# Patient Record
Sex: Female | Born: 1980 | Race: Black or African American | Hispanic: No | Marital: Single | Smoking: Never smoker
Health system: Southern US, Community
[De-identification: ages and names within clinical notes are randomized; demographics above are authoritative.]

## PROBLEM LIST (undated history)

## (undated) ENCOUNTER — Emergency Department (HOSPITAL_COMMUNITY): Payer: 59

## (undated) DIAGNOSIS — D219 Benign neoplasm of connective and other soft tissue, unspecified: Secondary | ICD-10-CM

## (undated) DIAGNOSIS — E119 Type 2 diabetes mellitus without complications: Secondary | ICD-10-CM

## (undated) DIAGNOSIS — E559 Vitamin D deficiency, unspecified: Secondary | ICD-10-CM

## (undated) DIAGNOSIS — M549 Dorsalgia, unspecified: Secondary | ICD-10-CM

## (undated) DIAGNOSIS — I10 Essential (primary) hypertension: Secondary | ICD-10-CM

## (undated) DIAGNOSIS — J449 Chronic obstructive pulmonary disease, unspecified: Secondary | ICD-10-CM

## (undated) DIAGNOSIS — E669 Obesity, unspecified: Secondary | ICD-10-CM

## (undated) DIAGNOSIS — J189 Pneumonia, unspecified organism: Secondary | ICD-10-CM

## (undated) DIAGNOSIS — Z91018 Allergy to other foods: Secondary | ICD-10-CM

## (undated) DIAGNOSIS — R0602 Shortness of breath: Secondary | ICD-10-CM

## (undated) DIAGNOSIS — G4730 Sleep apnea, unspecified: Secondary | ICD-10-CM

## (undated) DIAGNOSIS — F419 Anxiety disorder, unspecified: Secondary | ICD-10-CM

## (undated) DIAGNOSIS — F32A Depression, unspecified: Secondary | ICD-10-CM

## (undated) DIAGNOSIS — K219 Gastro-esophageal reflux disease without esophagitis: Secondary | ICD-10-CM

## (undated) DIAGNOSIS — R519 Headache, unspecified: Secondary | ICD-10-CM

## (undated) HISTORY — DX: Chronic obstructive pulmonary disease, unspecified: J44.9

## (undated) HISTORY — DX: Sleep apnea, unspecified: G47.30

## (undated) HISTORY — DX: Shortness of breath: R06.02

## (undated) HISTORY — DX: Essential (primary) hypertension: I10

## (undated) HISTORY — DX: Depression, unspecified: F32.A

## (undated) HISTORY — DX: Anxiety disorder, unspecified: F41.9

## (undated) HISTORY — DX: Vitamin D deficiency, unspecified: E55.9

## (undated) HISTORY — DX: Allergy to other foods: Z91.018

## (undated) HISTORY — PX: WISDOM TOOTH EXTRACTION: SHX21

## (undated) HISTORY — PX: GUM SURGERY: SHX658

## (undated) HISTORY — DX: Benign neoplasm of connective and other soft tissue, unspecified: D21.9

## (undated) HISTORY — DX: Type 2 diabetes mellitus without complications: E11.9

## (undated) HISTORY — DX: Dorsalgia, unspecified: M54.9

## (undated) HISTORY — DX: Obesity, unspecified: E66.9

## (undated) HISTORY — PX: TONSILLECTOMY: SHX5217

---

## 2000-12-20 ENCOUNTER — Ambulatory Visit (HOSPITAL_COMMUNITY): Admission: RE | Admit: 2000-12-20 | Discharge: 2000-12-20 | Payer: Self-pay | Admitting: *Deleted

## 2000-12-20 ENCOUNTER — Encounter: Payer: Self-pay | Admitting: *Deleted

## 2000-12-25 ENCOUNTER — Ambulatory Visit (HOSPITAL_COMMUNITY): Admission: RE | Admit: 2000-12-25 | Discharge: 2000-12-25 | Payer: Self-pay | Admitting: *Deleted

## 2000-12-25 ENCOUNTER — Encounter: Payer: Self-pay | Admitting: *Deleted

## 2001-02-02 ENCOUNTER — Emergency Department (HOSPITAL_COMMUNITY): Admission: EM | Admit: 2001-02-02 | Discharge: 2001-02-02 | Payer: Self-pay | Admitting: Emergency Medicine

## 2002-06-12 ENCOUNTER — Ambulatory Visit (HOSPITAL_COMMUNITY): Admission: RE | Admit: 2002-06-12 | Discharge: 2002-06-12 | Payer: Self-pay | Admitting: Obstetrics and Gynecology

## 2002-06-12 ENCOUNTER — Encounter: Payer: Self-pay | Admitting: Obstetrics and Gynecology

## 2004-03-16 ENCOUNTER — Other Ambulatory Visit: Admission: RE | Admit: 2004-03-16 | Discharge: 2004-03-16 | Payer: Self-pay

## 2006-06-30 ENCOUNTER — Emergency Department (HOSPITAL_COMMUNITY): Admission: EM | Admit: 2006-06-30 | Discharge: 2006-06-30 | Payer: Self-pay | Admitting: Emergency Medicine

## 2006-07-10 ENCOUNTER — Ambulatory Visit: Payer: Self-pay | Admitting: Orthopedic Surgery

## 2006-07-18 ENCOUNTER — Encounter (HOSPITAL_COMMUNITY): Admission: RE | Admit: 2006-07-18 | Discharge: 2006-08-17 | Payer: Self-pay | Admitting: Orthopedic Surgery

## 2006-08-07 ENCOUNTER — Ambulatory Visit: Payer: Self-pay | Admitting: Orthopedic Surgery

## 2006-08-21 ENCOUNTER — Encounter (HOSPITAL_COMMUNITY): Admission: RE | Admit: 2006-08-21 | Discharge: 2006-09-20 | Payer: Self-pay | Admitting: Orthopedic Surgery

## 2006-08-28 ENCOUNTER — Ambulatory Visit: Payer: Self-pay | Admitting: Orthopedic Surgery

## 2007-03-02 ENCOUNTER — Encounter: Admission: RE | Admit: 2007-03-02 | Discharge: 2007-03-02 | Payer: Self-pay | Admitting: Obstetrics & Gynecology

## 2007-05-14 ENCOUNTER — Emergency Department (HOSPITAL_COMMUNITY): Admission: EM | Admit: 2007-05-14 | Discharge: 2007-05-14 | Payer: Self-pay | Admitting: Emergency Medicine

## 2007-08-22 ENCOUNTER — Ambulatory Visit (HOSPITAL_COMMUNITY): Admission: RE | Admit: 2007-08-22 | Discharge: 2007-08-22 | Payer: Self-pay | Admitting: Internal Medicine

## 2008-09-22 ENCOUNTER — Emergency Department (HOSPITAL_COMMUNITY): Admission: EM | Admit: 2008-09-22 | Discharge: 2008-09-22 | Payer: Self-pay | Admitting: Emergency Medicine

## 2009-11-18 ENCOUNTER — Emergency Department (HOSPITAL_COMMUNITY): Admission: EM | Admit: 2009-11-18 | Discharge: 2009-11-18 | Payer: Self-pay | Admitting: Family Medicine

## 2010-01-06 ENCOUNTER — Other Ambulatory Visit: Admission: RE | Admit: 2010-01-06 | Discharge: 2010-01-06 | Payer: Self-pay | Admitting: Obstetrics and Gynecology

## 2010-02-03 ENCOUNTER — Ambulatory Visit: Payer: Self-pay | Admitting: Internal Medicine

## 2010-02-03 DIAGNOSIS — G4733 Obstructive sleep apnea (adult) (pediatric): Secondary | ICD-10-CM

## 2010-02-03 DIAGNOSIS — G478 Other sleep disorders: Secondary | ICD-10-CM

## 2010-02-03 HISTORY — DX: Obstructive sleep apnea (adult) (pediatric): G47.33

## 2010-02-08 DIAGNOSIS — E785 Hyperlipidemia, unspecified: Secondary | ICD-10-CM

## 2010-02-08 DIAGNOSIS — I10 Essential (primary) hypertension: Secondary | ICD-10-CM | POA: Insufficient documentation

## 2010-02-08 DIAGNOSIS — J45909 Unspecified asthma, uncomplicated: Secondary | ICD-10-CM | POA: Insufficient documentation

## 2010-02-08 DIAGNOSIS — J309 Allergic rhinitis, unspecified: Secondary | ICD-10-CM | POA: Insufficient documentation

## 2010-02-08 HISTORY — DX: Hyperlipidemia, unspecified: E78.5

## 2010-02-10 ENCOUNTER — Ambulatory Visit: Payer: Self-pay | Admitting: Cardiology

## 2010-02-10 LAB — CONVERTED CEMR LAB: Glucose, Bld: 72 mg/dL (ref 70–99)

## 2010-03-16 ENCOUNTER — Encounter: Payer: Self-pay | Admitting: Internal Medicine

## 2010-03-16 ENCOUNTER — Ambulatory Visit (HOSPITAL_BASED_OUTPATIENT_CLINIC_OR_DEPARTMENT_OTHER): Admission: RE | Admit: 2010-03-16 | Discharge: 2010-03-16 | Payer: Self-pay | Admitting: Internal Medicine

## 2010-03-31 ENCOUNTER — Ambulatory Visit: Payer: Self-pay | Admitting: Internal Medicine

## 2010-05-12 ENCOUNTER — Ambulatory Visit: Payer: Self-pay | Admitting: Internal Medicine
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
## 2010-09-29 ENCOUNTER — Other Ambulatory Visit: Payer: Self-pay | Admitting: Family Medicine

## 2010-09-29 DIAGNOSIS — R10811 Right upper quadrant abdominal tenderness: Secondary | ICD-10-CM

## 2010-09-30 ENCOUNTER — Ambulatory Visit
Admission: RE | Admit: 2010-09-30 | Discharge: 2010-09-30 | Disposition: A | Discharge status: Home / Self Care | Payer: BC Managed Care – PPO | Source: Ambulatory Visit | Attending: Family Medicine | Admitting: Family Medicine

## 2010-09-30 DIAGNOSIS — R10811 Right upper quadrant abdominal tenderness: Secondary | ICD-10-CM

## 2010-10-07 LAB — POCT RAPID STREP A (OFFICE): Streptococcus, Group A Screen (Direct): NEGATIVE

## 2010-10-27 ENCOUNTER — Other Ambulatory Visit: Payer: Self-pay | Admitting: Family Medicine

## 2010-10-27 DIAGNOSIS — R1011 Right upper quadrant pain: Secondary | ICD-10-CM

## 2010-10-29 ENCOUNTER — Other Ambulatory Visit: Payer: BC Managed Care – PPO

## 2010-11-02 ENCOUNTER — Ambulatory Visit
Admission: RE | Admit: 2010-11-02 | Discharge: 2010-11-02 | Disposition: A | Discharge status: Home / Self Care | Payer: BC Managed Care – PPO | Source: Ambulatory Visit | Attending: Family Medicine | Admitting: Family Medicine

## 2010-11-02 DIAGNOSIS — R1011 Right upper quadrant pain: Secondary | ICD-10-CM

## 2010-11-02 MED ORDER — IOHEXOL 300 MG/ML  SOLN
125.0000 mL | Freq: Once | INTRAMUSCULAR | Status: AC | PRN
  Administered 2010-11-02: 125 mL via INTRAVENOUS

## 2011-02-16 ENCOUNTER — Other Ambulatory Visit (HOSPITAL_COMMUNITY)
Admission: RE | Admit: 2011-02-16 | Discharge: 2011-02-16 | Disposition: A | Discharge status: Home / Self Care | Payer: BC Managed Care – PPO | Source: Ambulatory Visit | Attending: Obstetrics and Gynecology | Admitting: Obstetrics and Gynecology

## 2011-02-16 DIAGNOSIS — Z01419 Encounter for gynecological examination (general) (routine) without abnormal findings: Secondary | ICD-10-CM | POA: Insufficient documentation

## 2011-05-10 ENCOUNTER — Encounter: Payer: Self-pay | Admitting: Internal Medicine

## 2011-05-11 ENCOUNTER — Ambulatory Visit: Payer: Self-pay | Admitting: Internal Medicine

## 2012-08-05 ENCOUNTER — Encounter (HOSPITAL_COMMUNITY): Payer: Self-pay | Admitting: Emergency Medicine

## 2012-08-05 ENCOUNTER — Emergency Department (HOSPITAL_COMMUNITY)
Admission: EM | Admit: 2012-08-05 | Discharge: 2012-08-05 | Disposition: A | Discharge status: Home / Self Care | Payer: BC Managed Care – PPO | Source: Home / Self Care

## 2012-08-05 DIAGNOSIS — M62830 Muscle spasm of back: Secondary | ICD-10-CM

## 2012-08-05 DIAGNOSIS — M5380 Other specified dorsopathies, site unspecified: Secondary | ICD-10-CM

## 2012-08-05 MED ORDER — PREDNISONE (PAK) 10 MG PO TABS: 10.0000 mg | ORAL_TABLET | Freq: Every day | ORAL | Status: DC

## 2012-08-05 MED ORDER — HYDROCODONE-ACETAMINOPHEN 5-325 MG PO TABS: 1.0000 | ORAL_TABLET | Freq: Four times a day (QID) | ORAL | Status: DC | PRN

## 2012-08-05 NOTE — ED Provider Notes (Signed)
Gina Walker is a 32 y.o. female who presents to Urgent Care today for right sided back pain present since Wednesday. Pain located in the right lumbar and thoracic paraspinal muscles. One or 2 episodes of pain radiating to the right leg. No numbness weakness bowel bladder dysfunction or difficulty walking. Patient has tried ibuprofen and Tylenol which is on been mildly helpful. She denies any significant back history.  No fevers or chills or injury.    PMH reviewed. Obesity and asthma History  Substance Use Topics  . Smoking status: Current Every Day Smoker  . Smokeless tobacco: Not on file  . Alcohol Use: Yes     Comment: glass of wine 2x month   ROS as above Medications reviewed. No current facility-administered medications for this encounter.   Current Outpatient Prescriptions  Medication Sig Dispense Refill  . albuterol (PROAIR HFA) 108 (90 BASE) MCG/ACT inhaler Inhale 2 puffs into the lungs 4 (four) times daily as needed.        . budesonide-formoterol (SYMBICORT) 160-4.5 MCG/ACT inhaler Inhale 2 puffs into the lungs 2 (two) times daily.        . chlorthalidone (HYGROTON) 25 MG tablet Take 1 by mouth once daily to keep BP under 140/90       . cholecalciferol (VITAMIN D) 1000 UNITS tablet Take 1,000 Units by mouth daily.        . fluticasone (FLONASE) 50 MCG/ACT nasal spray Place 2 sprays into the nose at bedtime.        . fluticasone (VERAMYST) 27.5 MCG/SPRAY nasal spray Place 2 sprays into the nose at bedtime.        Marland Kitchen HYDROcodone-acetaminophen (NORCO/VICODIN) 5-325 MG per tablet Take 1 tablet by mouth every 6 (six) hours as needed for pain.  10 tablet  0  . Multiple Vitamin (MULTIVITAMIN) capsule Take 1 capsule by mouth daily.        . norethindrone (ERRIN) 0.35 MG tablet Take 1 tablet by mouth daily.        . potassium chloride SA (K-DUR,KLOR-CON) 20 MEQ tablet Take 20 mEq by mouth daily.        . predniSONE (STERAPRED UNI-PAK) 10 MG tablet Take 1 tablet (10 mg total) by mouth  daily. Days 1-4: Two tablets before breakfast, one after lunch, one after dinner, and two at bedtime. If started late in the day, take two tablets every hour for three hours, unless otherwise directed by prescriber. Days 5-8: One tablet before breakfast, one after lunch, one after dinner, and one at bedtime Days 9-12: One tablet before breakfast and one at bedtime Disp qs  1 tablet  0  . vitamin C (ASCORBIC ACID) 500 MG tablet Take 500 mg by mouth daily.        . Zinc 100 MG TABS Take 1 tablet by mouth daily.          Exam:  Pulse 74  Temp(Src) 99.2 F (37.3 C) (Oral)  Resp 17  SpO2 99% Gen: Well NAD BACK: Nontender spinal midline. Tender to palpation in right paraspinal muscles of the lumbar thoracic spine. Range of motion normal to flexion extension rotation and lateral flexion Reflexes 2+ equal in knees and ankles. Strength intact to toe standing heel standing squat with a normal gait.  Patient get on and off exam table by herself.  Sensation pulses and capillary refill are intact bilateral lower extremity.    No results found for this or any previous visit (from the past 24 hour(s)). No results  found.  Assessment and Plan: 32 y.o. female with back muscle spasm.  Plan for 12 date Sterapred DS Dosepak, and hydrocodone.  Offered physical therapy. Patient will think about it.  Followup as needed.  Discussed warning signs or symptoms. Please see discharge instructions. Patient expresses understanding.      Rodolph Bong, MD 08/05/12 1620

## 2012-08-05 NOTE — ED Notes (Signed)
Pt c/o right lower back and flank pain since Wednesday. Pt denies urinary symptoms. Some nausea and constipation. Pt has a hx of fibroids.   Pt states she is unable to sleep at night due to the pain.

## 2012-08-07 NOTE — ED Provider Notes (Signed)
Medical screening examination/treatment/procedure(s) were performed by resident physician or non-physician practitioner and as supervising physician I was immediately available for consultation/collaboration.   Barkley Bruns MD.   Linna Hoff, MD 08/07/12 [phone]

## 2013-01-10 ENCOUNTER — Ambulatory Visit: Payer: Self-pay | Admitting: Obstetrics & Gynecology

## 2013-01-11 ENCOUNTER — Encounter: Payer: Self-pay | Admitting: Obstetrics & Gynecology

## 2013-01-11 ENCOUNTER — Ambulatory Visit (INDEPENDENT_AMBULATORY_CARE_PROVIDER_SITE_OTHER): Payer: BC Managed Care – PPO | Admitting: Obstetrics & Gynecology

## 2013-01-11 VITALS — BP 130/80 | Ht 63.0 in | Wt 245.0 lb

## 2013-01-11 DIAGNOSIS — N920 Excessive and frequent menstruation with regular cycle: Secondary | ICD-10-CM

## 2013-01-11 DIAGNOSIS — D259 Leiomyoma of uterus, unspecified: Secondary | ICD-10-CM

## 2013-01-11 DIAGNOSIS — N946 Dysmenorrhea, unspecified: Secondary | ICD-10-CM | POA: Insufficient documentation

## 2013-01-11 DIAGNOSIS — Z3202 Encounter for pregnancy test, result negative: Secondary | ICD-10-CM

## 2013-01-11 LAB — POCT URINE PREGNANCY: Preg Test, Ur: NEGATIVE

## 2013-01-11 NOTE — Patient Instructions (Signed)
Uterine Fibroid A uterine fibroid is a growth (tumor) that occurs in a woman's uterus. This type of tumor is not cancerous and does not spread out of the uterus. A woman can have one or many fibroids, and the fiboid(s) can become quite large. A fibroid can vary in size, weight, and where it grows in the uterus. Most fibroids do not require medical treatment, but some can cause pain or heavy bleeding during and between periods. CAUSES  A fibroid is the result of a single uterine cell that keeps growing (unregulated), which is different than most cells in the human body. Most cells have a control mechanism that keeps them from reproducing without control.  SYMPTOMS   Bleeding.  Pelvic pain and pressure.  Bladder problems due to the size of the fibroid.  Infertility and miscarriages depending on the size and location of the fibroid. DIAGNOSIS  A diagnosis is made by physical exam. Your caregiver may feel the lumpy tumors during a pelvic exam. Important information regarding size, location, and number of tumors can be gained by having an ultrasound. It is rare that other tests, such as a CT scan or MRI, are needed. TREATMENT   Your caregiver may recommend watchful waiting. This involves getting the fibroid checked by your caregiver to see if the fibroids grow or shrink.   Hormonal treatment or an intrauterine device (IUD) may be prescribed.   Surgery may be needed to remove the fibroids (myomectomy) or the uterus (hysterectomy). This depends on your situation. When fibroids interfere with fertility and a woman wants to become pregnant, a caregiver may recommend having the fibroids removed.  HOME CARE INSTRUCTIONS  Home care depends on how you were treated. In general:   Keep all follow-up appointments with your caregiver.   Only take medicine as told by your caregiver. Do not take aspirin. It can cause bleeding.   If you have excessive periods and soak tampons or pads in a half hour or  less, contact your caregiver immediately. If your periods are troublesome but not so heavy, lie down with your feet raised slightly above your heart. Place cold packs on your lower abdomen.   If your periods are heavy, write down the number of pads or tampons you use per month. Bring this information to your caregiver.   Talk to your caregiver about taking iron pills.   Include green vegetables in your diet.   If you were prescribed a hormonal treatment, take the hormonal medicines as directed.   If you need surgery, ask your caregiver for information on your specific surgery.  SEEK IMMEDIATE MEDICAL CARE IF:  You have pelvic pain or cramps not controlled with medicines.   You have a sudden increase in pelvic pain.   You have an increase of bleeding between and during periods.   You feel lightheaded or have fainting episodes.  MAKE SURE YOU:  Understand these instructions.  Will watch your condition.  Will get help right away if you are not doing well or get worse. Document Released: 06/10/2000 Document Revised: 09/05/2011 Document Reviewed: 07/04/2011 [employer] Patient Information 2014 [employer].  

## 2013-01-11 NOTE — Progress Notes (Signed)
Patient ID: Gina Walker, female   DOB: [birth year], 32 y.o.   MRN: [id number] Gina Walker comes in for evaluation of a terrible menses in July she skipped her menstrual period in June her pregnancy test today is negative She says the pain from 1-10 was in 19 She is known to have fibroid Date she was told to years ago by Dr. Emelda Fear that they were his big as his hand She has had no imaging to differentiate number or size that I can find in her chart And she states she has had none  On exam today Her uterus is approximately 14 weeks' size and I can feel the outline of multiple fibroids adnexa feels negative nontender today  Essentially we did take some time with Sue Lush and evaluate the size and number of fibroids so we can make clinical decisions going forward She and her fianc are all fired up to have them removed but I told him we decided to take it one step at the time She's never been pregnant uses condoms sporadically as she gets pregnant So I will see her back in a couple of weeks to have a sonogram and talk about the findings of that

## 2013-01-29 ENCOUNTER — Ambulatory Visit (INDEPENDENT_AMBULATORY_CARE_PROVIDER_SITE_OTHER): Payer: BC Managed Care – PPO | Admitting: Obstetrics & Gynecology

## 2013-01-29 ENCOUNTER — Encounter: Payer: Self-pay | Admitting: Obstetrics & Gynecology

## 2013-01-29 ENCOUNTER — Other Ambulatory Visit: Payer: Self-pay | Admitting: Obstetrics & Gynecology

## 2013-01-29 ENCOUNTER — Ambulatory Visit (INDEPENDENT_AMBULATORY_CARE_PROVIDER_SITE_OTHER): Payer: BC Managed Care – PPO

## 2013-01-29 VITALS — BP 140/88 | Wt 245.0 lb

## 2013-01-29 DIAGNOSIS — D259 Leiomyoma of uterus, unspecified: Secondary | ICD-10-CM

## 2013-01-29 DIAGNOSIS — N920 Excessive and frequent menstruation with regular cycle: Secondary | ICD-10-CM

## 2013-01-29 DIAGNOSIS — N946 Dysmenorrhea, unspecified: Secondary | ICD-10-CM

## 2013-01-29 MED ORDER — NORGESTIMATE-ETH ESTRADIOL 0.25-35 MG-MCG PO TABS: 1.0000 | ORAL_TABLET | Freq: Every day | ORAL | Status: DC

## 2013-01-29 MED ORDER — KETOROLAC TROMETHAMINE 10 MG PO TABS: 10.0000 mg | ORAL_TABLET | Freq: Three times a day (TID) | ORAL | Status: DC | PRN

## 2013-01-29 NOTE — Patient Instructions (Addendum)
Uterine Fibroid A uterine fibroid is a growth (tumor) that occurs in a woman's uterus. This type of tumor is not cancerous and does not spread out of the uterus. A woman can have one or many fibroids, and the fiboid(s) can become quite large. A fibroid can vary in size, weight, and where it grows in the uterus. Most fibroids do not require medical treatment, but some can cause pain or heavy bleeding during and between periods. CAUSES  A fibroid is the result of a single uterine cell that keeps growing (unregulated), which is different than most cells in the human body. Most cells have a control mechanism that keeps them from reproducing without control.  SYMPTOMS   Bleeding.  Pelvic pain and pressure.  Bladder problems due to the size of the fibroid.  Infertility and miscarriages depending on the size and location of the fibroid. DIAGNOSIS  A diagnosis is made by physical exam. Your caregiver may feel the lumpy tumors during a pelvic exam. Important information regarding size, location, and number of tumors can be gained by having an ultrasound. It is rare that other tests, such as a CT scan or MRI, are needed. TREATMENT   Your caregiver may recommend watchful waiting. This involves getting the fibroid checked by your caregiver to see if the fibroids grow or shrink.   Hormonal treatment or an intrauterine device (IUD) may be prescribed.   Surgery may be needed to remove the fibroids (myomectomy) or the uterus (hysterectomy). This depends on your situation. When fibroids interfere with fertility and a woman wants to become pregnant, a caregiver may recommend having the fibroids removed.  HOME CARE INSTRUCTIONS  Home care depends on how you were treated. In general:   Keep all follow-up appointments with your caregiver.   Only take medicine as told by your caregiver. Do not take aspirin. It can cause bleeding.   If you have excessive periods and soak tampons or pads in a half hour or  less, contact your caregiver immediately. If your periods are troublesome but not so heavy, lie down with your feet raised slightly above your heart. Place cold packs on your lower abdomen.   If your periods are heavy, write down the number of pads or tampons you use per month. Bring this information to your caregiver.   Talk to your caregiver about taking iron pills.   Include green vegetables in your diet.   If you were prescribed a hormonal treatment, take the hormonal medicines as directed.   If you need surgery, ask your caregiver for information on your specific surgery.  SEEK IMMEDIATE MEDICAL CARE IF:  You have pelvic pain or cramps not controlled with medicines.   You have a sudden increase in pelvic pain.   You have an increase of bleeding between and during periods.   You feel lightheaded or have fainting episodes.  MAKE SURE YOU:  Understand these instructions.  Will watch your condition.  Will get help right away if you are not doing well or get worse. Document Released: 06/10/2000 Document Revised: 09/05/2011 Document Reviewed: 07/04/2011 [employer] Patient Information 2014 [employer].  

## 2013-01-29 NOTE — Progress Notes (Signed)
Patient ID: Gina Walker, female   DOB: 12-Nov-1980, 32 y.o.   MRN: [id number] Please refer to the note previously from July  Sonogram today is reviewed He see report  Recommend that we try oral contraceptive pills and Toradol and see if that will help her periods It should also shrink the fibroids at this size and maybe even shrink them We will see her back is need her for yearly or as needed

## 2013-02-11 ENCOUNTER — Other Ambulatory Visit: Payer: Self-pay | Admitting: Adult Health

## 2013-02-18 ENCOUNTER — Other Ambulatory Visit: Payer: Self-pay | Admitting: Adult Health

## 2013-02-20 ENCOUNTER — Other Ambulatory Visit: Payer: Self-pay | Admitting: Adult Health

## 2013-04-17 ENCOUNTER — Encounter: Payer: Self-pay | Admitting: Adult Health

## 2013-04-17 ENCOUNTER — Ambulatory Visit (INDEPENDENT_AMBULATORY_CARE_PROVIDER_SITE_OTHER): Payer: BC Managed Care – PPO | Admitting: Adult Health

## 2013-04-17 ENCOUNTER — Other Ambulatory Visit (HOSPITAL_COMMUNITY)
Admission: RE | Admit: 2013-04-17 | Discharge: 2013-04-17 | Disposition: A | Discharge status: Home / Self Care | Payer: BC Managed Care – PPO | Source: Ambulatory Visit | Attending: Adult Health | Admitting: Adult Health

## 2013-04-17 VITALS — BP 144/94 | HR 76 | Ht 63.5 in | Wt 251.0 lb

## 2013-04-17 DIAGNOSIS — J45909 Unspecified asthma, uncomplicated: Secondary | ICD-10-CM

## 2013-04-17 DIAGNOSIS — Z1151 Encounter for screening for human papillomavirus (HPV): Secondary | ICD-10-CM | POA: Insufficient documentation

## 2013-04-17 DIAGNOSIS — Z309 Encounter for contraceptive management, unspecified: Secondary | ICD-10-CM

## 2013-04-17 DIAGNOSIS — Z113 Encounter for screening for infections with a predominantly sexual mode of transmission: Secondary | ICD-10-CM

## 2013-04-17 DIAGNOSIS — Z01419 Encounter for gynecological examination (general) (routine) without abnormal findings: Secondary | ICD-10-CM

## 2013-04-17 DIAGNOSIS — E669 Obesity, unspecified: Secondary | ICD-10-CM

## 2013-04-17 DIAGNOSIS — D219 Benign neoplasm of connective and other soft tissue, unspecified: Secondary | ICD-10-CM | POA: Insufficient documentation

## 2013-04-17 DIAGNOSIS — I10 Essential (primary) hypertension: Secondary | ICD-10-CM

## 2013-04-17 HISTORY — DX: Obesity, unspecified: E66.9

## 2013-04-17 MED ORDER — NORGESTIMATE-ETH ESTRADIOL 0.25-35 MG-MCG PO TABS: 1.0000 | ORAL_TABLET | Freq: Every day | ORAL | Status: DC

## 2013-04-17 NOTE — Patient Instructions (Signed)
Calorie Counting Diet A calorie counting diet requires you to eat the number of calories that are right for you in a day. Calories are the measurement of how much energy you get from the food you eat. Eating the right amount of calories is important for staying at a healthy weight. If you eat too many calories, your body will store them as fat and you may gain weight. If you eat too few calories, you may lose weight. Counting the number of calories you eat during a day will help you know if you are eating the right amount. A Registered Dietitian can determine how many calories you need in a day. The amount of calories needed varies from person to person. If your goal is to lose weight, you will need to eat fewer calories. Losing weight can benefit you if you are overweight or have health problems such as heart disease, high blood pressure, or diabetes. If your goal is to gain weight, you will need to eat more calories. Gaining weight may be necessary if you have a certain health problem that causes your body to need more energy. TIPS Whether you are increasing or decreasing the number of calories you eat during a day, it may be hard to get used to changes in what you eat and drink. The following are tips to help you keep track of the number of calories you eat.  Measure foods at home with measuring cups. This helps you know the amount of food and number of calories you are eating.  Restaurants often serve food in amounts that are larger than 1 serving. While eating out, estimate how many servings of a food you are given. For example, a serving of cooked rice is  cup or about the size of half of a fist. Knowing serving sizes will help you be aware of how much food you are eating at restaurants.  Ask for smaller portion sizes or child-size portions at restaurants.  Plan to eat half of a meal at a restaurant. Take the rest home or share the other half with a friend.  Read the Nutrition Facts panel on  food labels for calorie content and serving size. You can find out how many servings are in a package, the size of a serving, and the number of calories each serving has.  For example, a package might contain 3 cookies. The Nutrition Facts panel on that package says that 1 serving is 1 cookie. Below that, it will say there are 3 servings in the container. The calories section of the Nutrition Facts label says there are 90 calories. This means there are 90 calories in 1 cookie (1 serving). If you eat 1 cookie you have eaten 90 calories. If you eat all 3 cookies, you have eaten 270 calories (3 servings x 90 calories = 270 calories). The list below tells you how big or small some common portion sizes are.  1 oz.........4 stacked dice.  3 oz.........Deck of cards.  1 tsp........Tip of little finger.  1 tbs........Thumb.  2 tbs........Golf ball.   cup.......Half of a fist.  1 cup........A fist. KEEP A FOOD LOG Write down every food item you eat, the amount you eat, and the number of calories in each food you eat during the day. At the end of the day, you can add up the total number of calories you have eaten. It may help to keep a list like the one below. Find out the calorie information by reading the   Nutrition Facts panel on food labels. Breakfast  Bran cereal (1 cup, 110 calories).  Fat-free milk ( cup, 45 calories). Snack  Apple (1 medium, 80 calories). Lunch  Spinach (1 cup, 20 calories).  Tomato ( medium, 20 calories).  Chicken breast strips (3 oz, 165 calories).  Shredded cheddar cheese ( cup, 110 calories).  Light [place] dressing (2 tbs, 60 calories).  Whole-wheat bread (1 slice, 80 calories).  Tub margarine (1 tsp, 35 calories).  Vegetable soup (1 cup, 160 calories). Dinner  Pork chop (3 oz, 190 calories).  Brown rice (1 cup, 215 calories).  Steamed broccoli ( cup, 20 calories).  Strawberries (1  cup, 65 calories).  Whipped cream (1 tbs, 50  calories). Daily Calorie Total: 1425 Document Released: 06/13/2005 Document Revised: 09/05/2011 Document Reviewed: 12/08/2006 [facility] Patient Information 2014 [place], [place]. Cut carbs Follow up in 1 year

## 2013-04-17 NOTE — Addendum Note (Signed)
Addended by: Cyril Mourning A on: 04/17/2013 03:20 PM   Modules accepted: Orders

## 2013-04-17 NOTE — Progress Notes (Signed)
Patient ID: Gina Walker, female   DOB: February 22, 1981, 32 y.o.   MRN: [id number] History of Present Illness: Hasset is a 32 year old black female single in for a pap and physical.She is on sprintec but last period was heavier than usual, she has known fibroids.   Current Medications, Allergies, Past Medical History, Past Surgical History, Family History and Social History were reviewed in [employer] record.     Review of Systems: Patient denies any headaches, blurred vision, shortness of breath, chest pain, abdominal pain, problems with bowel movements, urination, or intercourse. No joint pain or mood changes.    Physical Exam:BP 144/94  Pulse 76  Ht 5' 3.5" (1.613 m)  Wt 251 lb (113.853 kg)  BMI 43.76 kg/m2  LMP 03/27/2013 General:  Well developed, well nourished, no acute distress Skin:  Warm and dry Neck:  Midline trachea, normal thyroid Lungs; Clear to auscultation bilaterally Breast:  No dominant palpable mass, retraction, or nipple discharge Cardiovascular: Regular rate and rhythm Abdomen:  Soft, non tender, no hepatosplenomegaly Pelvic:  External genitalia is normal in appearance.  The vagina is normal in appearance.  The cervix is smooth pap performed with HPV.  Uterus is felt to be enlarged about 16 week size.Marland Kitchen  No                adnexal masses or tenderness noted. Extremities:  No swelling or varicosities noted Psych:  No mood changes, alert and cooperative  Impression: Yearly gyn exam Contraceptive management Fibroids History of hypertension and asthma Obesity  Plan: Physical in 1 year Refilled sprintec x 1 year Try Atkins diet at least cut carbs GC/CHL sent

## 2013-04-18 LAB — GC/CHLAMYDIA PROBE AMP
CT Probe RNA: NEGATIVE
GC Probe RNA: NEGATIVE

## 2013-04-24 ENCOUNTER — Other Ambulatory Visit: Payer: Self-pay | Admitting: *Deleted

## 2013-04-24 MED ORDER — NORGESTIMATE-ETH ESTRADIOL 0.25-35 MG-MCG PO TABS: 1.0000 | ORAL_TABLET | Freq: Every day | ORAL | Status: DC

## 2013-08-05 ENCOUNTER — Encounter (HOSPITAL_COMMUNITY): Payer: Self-pay | Admitting: Emergency Medicine

## 2013-08-05 ENCOUNTER — Emergency Department (HOSPITAL_COMMUNITY): Payer: BC Managed Care – PPO

## 2013-08-05 ENCOUNTER — Emergency Department (HOSPITAL_COMMUNITY)
Admission: EM | Admit: 2013-08-05 | Discharge: 2013-08-05 | Disposition: A | Discharge status: Home / Self Care | Payer: BC Managed Care – PPO | Attending: Emergency Medicine | Admitting: Emergency Medicine

## 2013-08-05 DIAGNOSIS — Z792 Long term (current) use of antibiotics: Secondary | ICD-10-CM | POA: Insufficient documentation

## 2013-08-05 DIAGNOSIS — R5383 Other fatigue: Secondary | ICD-10-CM

## 2013-08-05 DIAGNOSIS — J45901 Unspecified asthma with (acute) exacerbation: Secondary | ICD-10-CM | POA: Insufficient documentation

## 2013-08-05 DIAGNOSIS — Z79899 Other long term (current) drug therapy: Secondary | ICD-10-CM | POA: Insufficient documentation

## 2013-08-05 DIAGNOSIS — B9789 Other viral agents as the cause of diseases classified elsewhere: Secondary | ICD-10-CM

## 2013-08-05 DIAGNOSIS — R110 Nausea: Secondary | ICD-10-CM | POA: Insufficient documentation

## 2013-08-05 DIAGNOSIS — J069 Acute upper respiratory infection, unspecified: Secondary | ICD-10-CM | POA: Insufficient documentation

## 2013-08-05 DIAGNOSIS — IMO0002 Reserved for concepts with insufficient information to code with codable children: Secondary | ICD-10-CM | POA: Insufficient documentation

## 2013-08-05 DIAGNOSIS — M940 Chondrocostal junction syndrome [Tietze]: Secondary | ICD-10-CM | POA: Insufficient documentation

## 2013-08-05 DIAGNOSIS — R5381 Other malaise: Secondary | ICD-10-CM | POA: Insufficient documentation

## 2013-08-05 DIAGNOSIS — Z9109 Other allergy status, other than to drugs and biological substances: Secondary | ICD-10-CM | POA: Insufficient documentation

## 2013-08-05 DIAGNOSIS — Z8742 Personal history of other diseases of the female genital tract: Secondary | ICD-10-CM | POA: Insufficient documentation

## 2013-08-05 DIAGNOSIS — E669 Obesity, unspecified: Secondary | ICD-10-CM | POA: Insufficient documentation

## 2013-08-05 DIAGNOSIS — J988 Other specified respiratory disorders: Secondary | ICD-10-CM

## 2013-08-05 DIAGNOSIS — I10 Essential (primary) hypertension: Secondary | ICD-10-CM | POA: Insufficient documentation

## 2013-08-05 LAB — APTT: aPTT: 29 seconds (ref 24–37)

## 2013-08-05 LAB — COMPREHENSIVE METABOLIC PANEL
ALBUMIN: 3.3 g/dL — AB (ref 3.5–5.2)
ALT: 13 U/L (ref 0–35)
AST: 14 U/L (ref 0–37)
Alkaline Phosphatase: 55 U/L (ref 39–117)
BUN: 9 mg/dL (ref 6–23)
CALCIUM: 8.9 mg/dL (ref 8.4–10.5)
CO2: 22 mEq/L (ref 19–32)
Chloride: 101 mEq/L (ref 96–112)
Creatinine, Ser: 0.95 mg/dL (ref 0.50–1.10)
GFR calc Af Amer: 90 mL/min — ABNORMAL LOW (ref >90)
GFR calc non Af Amer: 77 mL/min — ABNORMAL LOW (ref >90)
Glucose, Bld: 94 mg/dL (ref 70–99)
POTASSIUM: 3.9 meq/L (ref 3.7–5.3)
Sodium: 139 mEq/L (ref 137–147)
TOTAL PROTEIN: 7.5 g/dL (ref 6.0–8.3)
Total Bilirubin: <0.2 mg/dL — ABNORMAL LOW (ref 0.3–1.2)

## 2013-08-05 LAB — URINE MICROSCOPIC-ADD ON

## 2013-08-05 LAB — CBC WITH DIFFERENTIAL/PLATELET
BASOS ABS: 0 10*3/uL (ref 0.0–0.1)
BASOS PCT: 0 % (ref 0–1)
EOS ABS: 0.3 10*3/uL (ref 0.0–0.7)
Eosinophils Relative: 4 % (ref 0–5)
HCT: 41.3 % (ref 36.0–46.0)
HEMOGLOBIN: 14.2 g/dL (ref 12.0–15.0)
Lymphocytes Relative: 36 % (ref 12–46)
Lymphs Abs: 3.1 10*3/uL (ref 0.7–4.0)
MCH: 29.2 pg (ref 26.0–34.0)
MCHC: 34.4 g/dL (ref 30.0–36.0)
MCV: 84.8 fL (ref 78.0–100.0)
MONOS PCT: 7 % (ref 3–12)
Monocytes Absolute: 0.6 10*3/uL (ref 0.1–1.0)
NEUTROS ABS: 4.4 10*3/uL (ref 1.7–7.7)
Neutrophils Relative %: 52 % (ref 43–77)
Platelets: 304 10*3/uL (ref 150–400)
RBC: 4.87 MIL/uL (ref 3.87–5.11)
RDW: 14.5 % (ref 11.5–15.5)
WBC: 8.4 10*3/uL (ref 4.0–10.5)

## 2013-08-05 LAB — URINALYSIS, ROUTINE W REFLEX MICROSCOPIC
BILIRUBIN URINE: NEGATIVE
GLUCOSE, UA: NEGATIVE mg/dL
KETONES UR: NEGATIVE mg/dL
Leukocytes, UA: NEGATIVE
Nitrite: NEGATIVE
PH: 6 (ref 5.0–8.0)
Protein, ur: NEGATIVE mg/dL
SPECIFIC GRAVITY, URINE: 1.016 (ref 1.005–1.030)
Urobilinogen, UA: 0.2 mg/dL (ref 0.0–1.0)

## 2013-08-05 LAB — POCT I-STAT TROPONIN I
TROPONIN I, POC: 0 ng/mL (ref 0.00–0.08)
Troponin i, poc: 0 ng/mL (ref 0.00–0.08)

## 2013-08-05 LAB — PRO B NATRIURETIC PEPTIDE

## 2013-08-05 MED ORDER — ALBUTEROL SULFATE (2.5 MG/3ML) 0.083% IN NEBU
5.0000 mg | INHALATION_SOLUTION | Freq: Once | RESPIRATORY_TRACT | Status: AC
  Administered 2013-08-05: 5 mg via RESPIRATORY_TRACT
  Filled 2013-08-05: qty 6

## 2013-08-05 MED ORDER — ALBUTEROL SULFATE HFA 108 (90 BASE) MCG/ACT IN AERS
2.0000 | INHALATION_SPRAY | Freq: Once | RESPIRATORY_TRACT | Status: AC
  Administered 2013-08-05: 2 via RESPIRATORY_TRACT
  Filled 2013-08-05: qty 6.7

## 2013-08-05 MED ORDER — IPRATROPIUM BROMIDE 0.02 % IN SOLN
0.5000 mg | Freq: Once | RESPIRATORY_TRACT | Status: AC
  Administered 2013-08-05: 0.5 mg via RESPIRATORY_TRACT
  Filled 2013-08-05: qty 2.5

## 2013-08-05 MED ORDER — PREDNISONE 20 MG PO TABS: 40.0000 mg | ORAL_TABLET | Freq: Every day | ORAL | Status: DC

## 2013-08-05 MED ORDER — ALBUTEROL SULFATE (2.5 MG/3ML) 0.083% IN NEBU: 5.0000 mg | INHALATION_SOLUTION | Freq: Four times a day (QID) | RESPIRATORY_TRACT | Status: DC | PRN

## 2013-08-05 MED ORDER — HYDROCODONE-HOMATROPINE 5-1.5 MG/5ML PO SYRP
5.0000 mL | ORAL_SOLUTION | Freq: Four times a day (QID) | ORAL | Status: DC | PRN
Start: 2013-08-05 — End: 2013-10-30

## 2013-08-05 MED ORDER — PREDNISONE 20 MG PO TABS
60.0000 mg | ORAL_TABLET | Freq: Once | ORAL | Status: AC
  Administered 2013-08-05: 60 mg via ORAL
  Filled 2013-08-05: qty 3

## 2013-08-05 NOTE — ED Notes (Signed)
Pt. Ambulated with pulse ox, pt O2 sats between 95-98%/RA, pt sts feels lightheaded on walk back to room. Claiborne Billings PA-C made aware.

## 2013-08-05 NOTE — ED Notes (Signed)
Pt spilled medication from neb treatment on floor. Claiborne Billings PA to be notified. Pt received unknown amount.

## 2013-08-05 NOTE — ED Notes (Signed)
Patient has been having chest pain for about four days.  She has had flu like symptoms and has been coughing thick, white sputum.  Patient thinks her chest pain is from the coughing.  She has SOB, back pain but denies dizziness, LOC, or any other symptoms.  Patient is here to be evaluated.

## 2013-08-05 NOTE — ED Provider Notes (Signed)
CSN: [id number]     Arrival date & time 08/05/13  1624 History   First MD Initiated Contact with Patient 08/05/13 2115     Chief Complaint  Patient presents with  . Chest Pain    patient has had chest pain for more than four days, she has had flu like symtpoms and has been coughing thinks the chest pain is from the coughing     (Consider location/radiation/quality/duration/timing/severity/associated sxs/prior Treatment) HPI Comments: Patient is a 33 year old female with a history of asthma who presents for chest pain x4 day. Patient states that pain is worse with coughing and deep breathing. She states she has had a cough productive of thick white sputum over the last few days. Symptoms also associated with shortness of breath, nasal congestion, postnasal drip, and chills. Patient saw her primary care provider for these complaints 6 days ago at which time she was given a Z-Pak and Robitussin. Patient completed course of azithromycin yesterday. She denies associated jaw pain, syncope or near syncope, hemoptysis, inability to swallow, drooling, jaw pain, vomiting, extremity numbness, and weakness. Patient denies history of ACS, hyperlipidemia, diabetes mellitus, and hypertension. She endorses a family history of ACS in grandparents in their 66's.  Patient is a 33 y.o. female presenting with chest pain. The history is provided by the patient. No language interpreter was used.  Chest Pain Associated symptoms: cough, fatigue, nausea and shortness of breath   Associated symptoms: no abdominal pain, no dizziness, no fever and not vomiting     Past Medical History  Diagnosis Date  . Allergic rhinitis   . Asthma   . HTN (hypertension)   . Fibroids   . Obesity 04/17/2013   Past Surgical History  Procedure Laterality Date  . Tonsillectomy    . Gum surgery    . Wisdom tooth extraction     Family History  Problem Relation Age of Onset  . Sleep apnea Father   . Colon cancer Maternal Grandmother    . Heart disease Maternal Grandmother   . Cancer Mother     breast  . Cancer Maternal Aunt     lung  . Clotting disorder Maternal Aunt   . Heart disease Paternal Grandfather    History  Substance Use Topics  . Smoking status: Never Smoker   . Smokeless tobacco: Never Used  . Alcohol Use: Yes     Comment: glass of wine 2x month   OB History   Grav Para Term Preterm Abortions TAB SAB Ect Mult Living                 Review of Systems  Constitutional: Positive for chills and fatigue. Negative for fever.  HENT: Positive for congestion, postnasal drip and sore throat.   Respiratory: Positive for cough, chest tightness and shortness of breath.   Cardiovascular: Positive for chest pain.  Gastrointestinal: Positive for nausea. Negative for vomiting and abdominal pain.  Neurological: Negative for dizziness, syncope and light-headedness.  All other systems reviewed and are negative.    Allergies  Cefuroxime axetil and Shellfish allergy  Home Medications   Current Outpatient Rx  Name  Route  Sig  Dispense  Refill  . albuterol (PROAIR HFA) 108 (90 BASE) MCG/ACT inhaler   Inhalation   Inhale 2 puffs into the lungs 4 (four) times daily as needed.           . budesonide-formoterol (SYMBICORT) 160-4.5 MCG/ACT inhaler   Inhalation   Inhale 2 puffs into the lungs 2 (two)  times daily.           . cholecalciferol (VITAMIN D) 1000 UNITS tablet   Oral   Take 1,000 Units by mouth daily.           Marland Kitchen guaiFENesin (ROBITUSSIN) 100 MG/5ML liquid   Oral   Take 200 mg by mouth 2 (two) times daily as needed for cough.         . hydrocodone-ibuprofen (VICOPROFEN) 5-200 MG per tablet   Oral   Take 1 tablet by mouth every 8 (eight) hours as needed for pain.         Marland Kitchen ibuprofen (ADVIL,MOTRIN) 200 MG tablet   Oral   Take 200 mg by mouth every 6 (six) hours as needed for moderate pain.         Marland Kitchen losartan-hydrochlorothiazide (HYZAAR) 100-12.5 MG per tablet   Oral   Take 1 tablet  by mouth daily.          . norgestimate-ethinyl estradiol (ORTHO-CYCLEN,SPRINTEC,PREVIFEM) 0.25-35 MG-MCG tablet   Oral   Take 1 tablet by mouth daily.   1 Package   11   . tetrahydrozoline-zinc (VISINE-AC) 0.05-0.25 % ophthalmic solution      2 drops 3 (three) times daily as needed.         . verapamil (CALAN) 120 MG tablet   Oral   Take 120 mg by mouth daily.          . vitamin C (ASCORBIC ACID) 500 MG tablet   Oral   Take 1,000 mg by mouth daily.          Marland Kitchen albuterol (PROVENTIL) (2.5 MG/3ML) 0.083% nebulizer solution   Nebulization   Take 6 mLs (5 mg total) by nebulization every 6 (six) hours as needed for wheezing or shortness of breath.   75 mL   1   . azithromycin (ZITHROMAX) 250 MG tablet   Oral   Take 250 mg by mouth daily.         Marland Kitchen HYDROcodone-homatropine (HYCODAN) 5-1.5 MG/5ML syrup   Oral   Take 5 mLs by mouth every 6 (six) hours as needed for cough.   120 mL   0   . predniSONE (DELTASONE) 20 MG tablet   Oral   Take 2 tablets (40 mg total) by mouth daily.   10 tablet   0    BP 162/90  Pulse 107  Temp(Src) 98.7 F (37.1 C) (Oral)  Resp 20  Ht 5' 8.5" (1.74 m)  Wt 252 lb (114.306 kg)  BMI 37.75 kg/m2  SpO2 98%  LMP 08/05/2013  Physical Exam  Nursing note and vitals reviewed. Constitutional: She is oriented to person, place, and time. She appears well-developed and well-nourished. No distress.  HENT:  Head: Normocephalic and atraumatic.  Mouth/Throat: Oropharynx is clear and moist. No oropharyngeal exudate.  Uvula midline and patient tolerating secretions without difficulty.  Eyes: Conjunctivae and EOM are normal. Pupils are equal, round, and reactive to light. No scleral icterus.  Neck: Normal range of motion. Neck supple.  Cardiovascular: Normal rate, regular rhythm, normal heart sounds and intact distal pulses.   Pulmonary/Chest: Effort normal. No respiratory distress. She has no wheezes. She has no rales.  Decreased breath sounds  diffusely. No retractions or accessory muscle use. Symmetric chest expansion.  Abdominal: Soft. There is no tenderness. There is no rebound and no guarding.  Musculoskeletal: Normal range of motion.  Neurological: She is alert and oriented to person, place, and time.  Skin: Skin is warm  and dry. No rash noted. She is not diaphoretic. No erythema. No pallor.  Psychiatric: She has a normal mood and affect. Her behavior is normal.    ED Course  Procedures (including critical care time) Labs Review Labs Reviewed  COMPREHENSIVE METABOLIC PANEL - Abnormal; Notable for the following:    Albumin 3.3 (*)    Total Bilirubin <0.2 (*)    GFR calc non Af Amer 77 (*)    GFR calc Af Amer 90 (*)    All other components within normal limits  URINALYSIS, ROUTINE W REFLEX MICROSCOPIC - Abnormal; Notable for the following:    Hgb urine dipstick SMALL (*)    All other components within normal limits  PRO B NATRIURETIC PEPTIDE  APTT  URINE MICROSCOPIC-ADD ON  CBC WITH DIFFERENTIAL  POCT I-STAT TROPONIN I  POCT I-STAT TROPONIN I   Imaging Review Dg Chest 2 View  08/05/2013   CLINICAL DATA:  Chest pain.  EXAM: CHEST  2 VIEW  COMPARISON:  None.  FINDINGS: The heart size and mediastinal contours are within normal limits. Both lungs are clear. No pneumothorax or pleural effusion is noted. The visualized skeletal structures are unremarkable.  IMPRESSION: No active cardiopulmonary disease.   Electronically Signed   By: Sabino Dick M.D.   On: 08/05/2013 17:17    EKG Interpretation    Date/Time:  Monday August 05 2013 16:30:04 EST Ventricular Rate:  90 PR Interval:  136 QRS Duration: 80 QT Interval:  352 QTC Calculation: 430 R Axis:   13 Text Interpretation:  Normal sinus rhythm Nonspecific T wave abnormality Abnormal ECG no prior for comparison Confirmed by DOCHERTY  MD, MEGAN ([id number]) on 08/05/2013 10:41:36 PM            MDM   Final diagnoses:  Viral respiratory illness  Costochondritis    33 year old female presents for chest pain and the presence of shortness of breath, nasal congestion, postnasal drip, and cough productive of white sputum. Patient is well and nontoxic appearing, hemodynamically stable, and afebrile. She has no leukocytosis today or anemia. There is no electrolyte imbalance and liver and kidney function both preserved. Doubt ACS the symptoms atypical for this. Patient also with normal EKG and negative serial troponins. Chest x-ray negative for focal consolidation or pneumonia as well as any other acute cardiopulmonary finding. Chest pain likely secondary to musculoskeletal pain and costochondritis from frequent coughing. Doubt pulmonary embolism as patient without tachycardia, tachypnea, dyspnea, or hypoxia; tachycardia only appreciated after initiation of nebulizer treatments.  Patient treated in the ED with DuoNeb x2 and oral steroids. She endorses improvement in symptoms with nebulizer treatments. Patient ambulates in ED without hypoxia. Symptoms consistent with viral respiratory illness. Believe patient is stable for discharge with primary care followup; have also recommended followup with her pulmonary specialist if possible regarding her symptoms. Will prescribe albuterol inhaler as well as nebulizer solution and 5 day course of oral steroids. Hycodan prescribed for cough as needed. Return precautions provided and patient able to plan with no unaddressed concerns.    Antonietta Breach, PA-C 08/05/13 2340

## 2013-08-05 NOTE — Discharge Instructions (Signed)
Bronchitis Bronchitis is inflammation of the airways that extend from the windpipe into the lungs (bronchi). The inflammation often causes mucus to develop, which leads to a cough. If the inflammation becomes severe, it may cause shortness of breath. CAUSES  Bronchitis may be caused by:   Viral infections.   Bacteria.   Cigarette smoke.   Allergens, pollutants, and other irritants.  SIGNS AND SYMPTOMS  The most common symptom of bronchitis is a frequent cough that produces mucus. Other symptoms include:  Fever.   Body aches.   Chest congestion.   Chills.   Shortness of breath.   Sore throat.  DIAGNOSIS  Bronchitis is usually diagnosed through a medical history and physical exam. Tests, such as chest X-rays, are sometimes done to rule out other conditions.  TREATMENT  You may need to avoid contact with whatever caused the problem (smoking, for example). Medicines are sometimes needed. These may include:  Antibiotics. These may be prescribed if the condition is caused by bacteria.  Cough suppressants. These may be prescribed for relief of cough symptoms.   Inhaled medicines. These may be prescribed to help open your airways and make it easier for you to breathe.   Steroid medicines. These may be prescribed for those with recurrent (chronic) bronchitis. HOME CARE INSTRUCTIONS  Get plenty of rest.   Drink enough fluids to keep your urine clear or pale yellow (unless you have a medical condition that requires fluid restriction). Increasing fluids may help thin your secretions and will prevent dehydration.   Only take over-the-counter or prescription medicines as directed by your health care provider.  Only take antibiotics as directed. Make sure you finish them even if you start to feel better.  Avoid secondhand smoke, irritating chemicals, and strong fumes. These will make bronchitis worse. If you are a smoker, quit smoking. Consider using nicotine gum or  skin patches to help control withdrawal symptoms. Quitting smoking will help your lungs heal faster.   Put a cool-mist humidifier in your bedroom at night to moisten the air. This may help loosen mucus. Change the water in the humidifier daily. You can also run the hot water in your shower and sit in the bathroom with the door closed for 5 10 minutes.   Follow up with your health care provider as directed.   Wash your hands frequently to avoid catching bronchitis again or spreading an infection to others.  SEEK MEDICAL CARE IF: Your symptoms do not improve after 1 week of treatment.  SEEK IMMEDIATE MEDICAL CARE IF:  Your fever increases.  You have chills.   You have chest pain.   You have worsening shortness of breath.   You have bloody sputum.  You faint.  You have lightheadedness.  You have a severe headache.   You vomit repeatedly. MAKE SURE YOU:   Understand these instructions.  Will watch your condition.  Will get help right away if you are not doing well or get worse. Document Released: 06/13/2005 Document Revised: 04/03/2013 Document Reviewed: 02/05/2013 [facility] Patient Information 2014 [place]. Costochondritis Costochondritis is a condition in which the tissue (cartilage) that connects your ribs with your breastbone (sternum) becomes irritated. It causes pain in the chest and rib area. It usually goes away on its own over time. HOME CARE  Avoid activities that wear you out.  Do not strain your ribs. Avoid activities that use your:  Chest.  Belly.  Side muscles.  Put ice on the area for the first 2 days after  the pain starts.  Put ice in a plastic bag.  Place a towel between your skin and the bag.  Leave the ice on for 20 minutes, 2 3 times a day.  Only take medicine as told by your doctor. GET HELP IF:  You have redness or puffiness (swelling) in the rib area.  Your pain does not go away with rest or medicine. GET HELP RIGHT  AWAY IF:   Your pain gets worse.  You are very uncomfortable.  You have trouble breathing.  You cough up blood.  You start sweating or throwing up (vomiting).  You have a fever or lasting symptoms for more than 2 3 days.  You have a fever and your symptoms suddenly get worse. MAKE SURE YOU:   Understand these instructions.  Will watch your condition.  Will get help right away if you are not doing well or get worse. Document Released: 11/30/2007 Document Revised: 02/13/2013 Document Reviewed: 01/15/2013 [facility] Patient Information 2014 [place].

## 2013-08-06 NOTE — ED Provider Notes (Signed)
Medical screening examination/treatment/procedure(s) were performed by non-physician practitioner and as supervising physician I was immediately available for consultation/collaboration.  EKG Interpretation    Date/Time:  Monday August 05 2013 16:30:04 EST Ventricular Rate:  90 PR Interval:  136 QRS Duration: 80 QT Interval:  352 QTC Calculation: 430 R Axis:   13 Text Interpretation:  Normal sinus rhythm Nonspecific T wave abnormality Abnormal ECG no prior for comparison Confirmed by DOCHERTY  MD, MEGAN ([id number]) on 08/05/2013 10:41:36 PM              Neta Ehlers, MD 08/06/13 [phone]

## 2013-08-20 ENCOUNTER — Institutional Professional Consult (permissible substitution): Payer: BC Managed Care – PPO | Admitting: Critical Care Medicine

## 2013-08-29 ENCOUNTER — Institutional Professional Consult (permissible substitution): Payer: BC Managed Care – PPO | Admitting: Critical Care Medicine

## 2013-09-12 ENCOUNTER — Institutional Professional Consult (permissible substitution): Payer: BC Managed Care – PPO | Admitting: Critical Care Medicine

## 2013-09-18 ENCOUNTER — Ambulatory Visit (INDEPENDENT_AMBULATORY_CARE_PROVIDER_SITE_OTHER): Payer: BC Managed Care – PPO | Admitting: Internal Medicine

## 2013-09-18 ENCOUNTER — Encounter (INDEPENDENT_AMBULATORY_CARE_PROVIDER_SITE_OTHER): Payer: Self-pay

## 2013-09-18 ENCOUNTER — Encounter: Payer: Self-pay | Admitting: Internal Medicine

## 2013-09-18 VITALS — BP 128/84 | HR 79 | Temp 98.3°F | Ht 63.0 in | Wt 251.0 lb

## 2013-09-18 DIAGNOSIS — J45909 Unspecified asthma, uncomplicated: Secondary | ICD-10-CM

## 2013-09-18 DIAGNOSIS — Z23 Encounter for immunization: Secondary | ICD-10-CM

## 2013-09-18 MED ORDER — MONTELUKAST SODIUM 10 MG PO TABS: ORAL_TABLET | ORAL | Status: DC

## 2013-09-18 NOTE — Patient Instructions (Addendum)
Pneumovax today   Plan A = automatic= add singulair 10 mg one each pm and Continue symbicort 160 Take 2 puffs first thing in am and then another 2 puffs about 12 hours later.   Plan B = backup  Only use your albuterol (ventolin)as a rescue medication to be used if you can't catch your breath by resting or doing a relaxed purse lip breathing pattern.  - The less you use it, the better it will work when you need it. - Ok to use up to 2 puffs  every 4 hours if you must but call for immediate appointment if use goes up over your usual need - Don't leave home without it !!  (think of it like the spare tire for your car)  - Albuterol neb up 4 hours if not better with ventolin  BCPs may be need to be modified with less progesterone  GERD (REFLUX)  is an extremely common cause of respiratory symptoms, many times with no significant heartburn at all.    It can be treated with medication, but also with lifestyle changes including avoidance of late meals, excessive alcohol, smoking cessation, and avoid fatty foods, chocolate, peppermint, colas, red wine, and acidic juices such as orange juice.  NO MINT OR MENTHOL PRODUCTS SO NO COUGH DROPS  USE SUGARLESS CANDY INSTEAD (jolley ranchers or Stover's)  NO OIL BASED VITAMINS - use powdered substitutes.   Please schedule a follow up office visit in 6 weeks, call sooner if needed

## 2013-09-18 NOTE — Progress Notes (Signed)
   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  09/18/2013 1st [facility] office visit/ Cayson Kalb Chief Complaint  Patient presents with  . Pulmonary Consult    Self referral for SOB   on symbicort 160 2bid and no saba x one week, ok hfa, mostly concerned with throat congestion and chest "congestion" but no purulent sputum, worse day than noct, assoc with sense of pnds.  Onset was abuptly worse with flu like illness in early feb 2015 but no longer has any f  Review of Systems  Constitutional: Negative for fever and unexpected weight change.  HENT: Positive for postnasal drip, sinus pressure and sneezing. Negative for congestion, dental problem, ear pain, nosebleeds, rhinorrhea, sore throat and trouble swallowing.   Eyes: Negative for redness and itching.  Respiratory: Positive for choking, chest tightness, shortness of breath and wheezing. Negative for cough.   Cardiovascular: Positive for chest pain. Negative for palpitations and leg swelling.  Gastrointestinal: Positive for abdominal distention. Negative for nausea and vomiting.  Genitourinary: Negative for dysuria.  Musculoskeletal: Negative for joint swelling.  Skin: Negative for rash.  Neurological: Positive for headaches.  Hematological: Does not bruise/bleed easily.  Psychiatric/Behavioral: Positive for dysphoric mood. The patient is nervous/anxious.        Objective:   Physical  Exam  amb bf with freq throat clearing  Wt Readings from Last 3 Encounters:  09/18/13 251 lb (113.853 kg)  08/05/13 252 lb (114.306 kg)  04/17/13 251 lb (113.853 kg)      HEENT: nl dentition, turbinates, and orophanx. Nl external ear canals without cough reflex   NECK :  without JVD/Nodes/TM/ nl carotid upstrokes bilaterally   LUNGS: no acc muscle use, clear to A and P bilaterally without cough on insp or exp maneuvers   CV:  RRR  no s3 or murmur or increase in P2, no edema   ABD:  soft and nontender with nl excursion in the supine position. No bruits or organomegaly, bowel sounds nl  MS:  warm without deformities, calf tenderness, cyanosis or clubbing  SKIN: warm and dry without lesions    NEURO:  alert, approp, no deficits  08/05/13 cxr No active cardiopulmonary disease.           Assessment & Plan:

## 2013-09-19 NOTE — Assessment & Plan Note (Addendum)
DDX of  difficult airways managment all start with A and  include Adherence, Ace Inhibitors, Acid Reflux, Active Sinus Disease, Alpha 1 Antitripsin deficiency, Anxiety masquerading as Airways dz,  ABPA,  allergy(esp in young), Aspiration (esp in elderly), Adverse effects of DPI,  Active smokers, plus two Bs  = Bronchiectasis and Beta blocker use..and one C= CHF  Adherence is always the initial "prime suspect" and is a multilayered concern that requires a "trust but verify" approach in every patient - starting with knowing how to use medications, especially inhalers, correctly, keeping up with refills and understanding the fundamental difference between maintenance and prns vs those medications only taken for a very short course and then stopped and not refilled.  - The proper method of use, as well as anticipated side effects, of a metered-dose inhaler are discussed and demonstrated to the patient. Improved effectiveness after extensive coaching during this visit to a level of approximately  75% so ok to continue symbicort  ? Acid (or non-acid) GERD > always difficult to exclude as up to 75% of pts in some series report no assoc GI/ Heartburn symptoms and she has risk factors of obesity / bcp use > rec max (24h)  acid suppression and diet restrictions/ reviewed and instructions given in writing.   ? Allergy > add singulair 10 mg daily on a trial basis to maint rx with symbicort

## 2013-10-30 ENCOUNTER — Encounter: Payer: Self-pay | Admitting: Internal Medicine

## 2013-10-30 ENCOUNTER — Ambulatory Visit (INDEPENDENT_AMBULATORY_CARE_PROVIDER_SITE_OTHER): Payer: BC Managed Care – PPO | Admitting: Internal Medicine

## 2013-10-30 ENCOUNTER — Encounter (INDEPENDENT_AMBULATORY_CARE_PROVIDER_SITE_OTHER): Payer: Self-pay

## 2013-10-30 VITALS — BP 146/84 | HR 85 | Temp 100.0°F | Ht 63.0 in | Wt 258.0 lb

## 2013-10-30 DIAGNOSIS — J45909 Unspecified asthma, uncomplicated: Secondary | ICD-10-CM

## 2013-10-30 MED ORDER — ALBUTEROL SULFATE HFA 108 (90 BASE) MCG/ACT IN AERS: 2.0000 | INHALATION_SPRAY | Freq: Four times a day (QID) | RESPIRATORY_TRACT | Status: DC | PRN

## 2013-10-30 MED ORDER — MONTELUKAST SODIUM 10 MG PO TABS: ORAL_TABLET | ORAL | Status: DC

## 2013-10-30 NOTE — Patient Instructions (Signed)
No changes in medications x 3 month  Please schedule a follow up visit in 3 months but call sooner if needed

## 2013-10-30 NOTE — Progress Notes (Signed)
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   History of Present Illnes 09/18/2013 1st [facility] office visit/ Gina Walker Chief Complaint  Patient presents with  . Pulmonary Consult    Self referral for SOB   on symbicort 160 2bid and no saba x one week, ok hfa, mostly concerned with throat congestion and chest "congestion" but no purulent sputum, worse day than noct, assoc with sense of pnds. Onset was abuptly worse with flu like illness in early feb 2015 but no longer has any flu-like symptoms rec Pneumovax today  Plan A = automatic= add singulair 10 mg one each pm and Continue symbicort 160 Take 2 puffs first thing in am and then another 2 puffs about 12 hours later.  Plan B = backup  Only use your albuterol (ventolin)as a rescue medication   - Albuterol neb up 4 hours if not better with ventolin BCPs may be need to be modified with less progesterone GERD diet    10/30/2013 f/u ov/Gina Walker re: singulair/symbicort 160 2bid  Chief Complaint  Patient presents with  . Follow-up    Pt reports that her breathing is back to normal baseline with needs for rescue inhaler only once since her last visit.      Not limited by breathing from desired activities    No obvious day to day or daytime variabilty or assoc chronic cough or cp or chest tightness, subjective wheeze overt sinus or hb symptoms. No unusual exp hx or h/o childhood pna/  asthma or knowledge of premature birth.  Sleeping ok without nocturnal  or early am exacerbation  of respiratory  c/o's or need for noct saba. Also denies any obvious fluctuation of symptoms with weather or environmental changes or other aggravating or alleviating factors except as outlined above   Current Medications, Allergies, Complete Past Medical History, Past Surgical History, Family History, and Social History were reviewed in [employer] record.  ROS  The following are not active complaints unless bolded sore throat, dysphagia, dental problems, itching, sneezing,  nasal congestion or excess/ purulent secretions, ear ache,   fever, chills, sweats, unintended wt Walker, pleuritic or exertional cp, hemoptysis,  orthopnea pnd or leg swelling, presyncope, palpitations, heartburn, abdominal pain, anorexia, nausea, vomiting, diarrhea  or change in bowel or urinary habits, change in stools or urine, dysuria,hematuria,  rash, arthralgias, visual complaints, headache, numbness weakness or ataxia or problems with walking or coordination,  change in mood/affect or memory.             Objective:   Physical Exam  amb bf   10/30/2013          258  Wt Readings from Last 3 Encounters:  09/18/13 251 lb (113.853 kg)  08/05/13 252 lb (114.306 kg)  04/17/13 251 lb (113.853 kg)      HEENT: nl dentition, turbinates, and orophanx. Nl external ear canals without cough reflex  NECK :  without JVD/Nodes/TM/ nl carotid upstrokes bilaterally   LUNGS: no acc muscle use, clear to A and P bilaterally without cough on insp or exp maneuvers   CV:  RRR  no s3 or murmur or increase in P2, no edema   ABD:  soft and nontender with nl excursion in the supine position. No bruits or organomegaly, bowel sounds nl  MS:  warm without deformities, calf tenderness, cyanosis or clubbing  SKIN: warm and dry without lesions    NEURO:  alert, approp, no deficits     08/05/13 cxr No active  cardiopulmonary disease.           Assessment & Plan:

## 2013-10-31 NOTE — Assessment & Plan Note (Signed)
The proper method of use, as well as anticipated side effects, of a metered-dose inhaler are discussed and demonstrated to the patient. Improved effectiveness after extensive coaching during this visit to a level of approximately  90% now  All goals of chronic asthma control met including optimal function and elimination of symptoms with minimal need for rescue therapy.  Contingencies discussed in full including contacting this office immediately if not controlling the symptoms using the rule of two's.

## 2014-04-19 ENCOUNTER — Other Ambulatory Visit: Payer: Self-pay | Admitting: Adult Health

## 2014-07-15 ENCOUNTER — Other Ambulatory Visit: Payer: Self-pay | Admitting: Obstetrics & Gynecology

## 2014-07-15 ENCOUNTER — Other Ambulatory Visit (HOSPITAL_COMMUNITY)
Admission: RE | Admit: 2014-07-15 | Discharge: 2014-07-15 | Disposition: A | Discharge status: Home / Self Care | Payer: BLUE CROSS/BLUE SHIELD | Source: Ambulatory Visit | Attending: Obstetrics & Gynecology | Admitting: Obstetrics & Gynecology

## 2014-07-15 DIAGNOSIS — Z01411 Encounter for gynecological examination (general) (routine) with abnormal findings: Secondary | ICD-10-CM | POA: Diagnosis not present

## 2014-07-15 DIAGNOSIS — Z113 Encounter for screening for infections with a predominantly sexual mode of transmission: Secondary | ICD-10-CM | POA: Diagnosis present

## 2014-07-16 LAB — CYTOLOGY - PAP

## 2015-09-24 ENCOUNTER — Emergency Department (HOSPITAL_COMMUNITY)
Admission: EM | Admit: 2015-09-24 | Discharge: 2015-09-24 | Disposition: A | Discharge status: Home / Self Care | Payer: 59 | Attending: Emergency Medicine | Admitting: Emergency Medicine

## 2015-09-24 ENCOUNTER — Emergency Department (HOSPITAL_COMMUNITY): Payer: 59

## 2015-09-24 ENCOUNTER — Encounter (HOSPITAL_COMMUNITY): Payer: Self-pay | Admitting: *Deleted

## 2015-09-24 DIAGNOSIS — J45901 Unspecified asthma with (acute) exacerbation: Secondary | ICD-10-CM | POA: Diagnosis not present

## 2015-09-24 DIAGNOSIS — J45909 Unspecified asthma, uncomplicated: Secondary | ICD-10-CM | POA: Diagnosis present

## 2015-09-24 DIAGNOSIS — Z79899 Other long term (current) drug therapy: Secondary | ICD-10-CM | POA: Insufficient documentation

## 2015-09-24 DIAGNOSIS — E669 Obesity, unspecified: Secondary | ICD-10-CM | POA: Insufficient documentation

## 2015-09-24 DIAGNOSIS — Z793 Long term (current) use of hormonal contraceptives: Secondary | ICD-10-CM | POA: Insufficient documentation

## 2015-09-24 DIAGNOSIS — Z7951 Long term (current) use of inhaled steroids: Secondary | ICD-10-CM | POA: Insufficient documentation

## 2015-09-24 DIAGNOSIS — Z86018 Personal history of other benign neoplasm: Secondary | ICD-10-CM | POA: Insufficient documentation

## 2015-09-24 DIAGNOSIS — R000 Tachycardia, unspecified: Secondary | ICD-10-CM | POA: Insufficient documentation

## 2015-09-24 DIAGNOSIS — R110 Nausea: Secondary | ICD-10-CM | POA: Insufficient documentation

## 2015-09-24 DIAGNOSIS — R6883 Chills (without fever): Secondary | ICD-10-CM | POA: Insufficient documentation

## 2015-09-24 DIAGNOSIS — I10 Essential (primary) hypertension: Secondary | ICD-10-CM | POA: Diagnosis not present

## 2015-09-24 MED ORDER — ALBUTEROL SULFATE (2.5 MG/3ML) 0.083% IN NEBU
INHALATION_SOLUTION | RESPIRATORY_TRACT | Status: AC
  Filled 2015-09-24: qty 6

## 2015-09-24 MED ORDER — PREDNISONE 20 MG PO TABS: ORAL_TABLET | ORAL | Status: DC

## 2015-09-24 MED ORDER — ALBUTEROL SULFATE (2.5 MG/3ML) 0.083% IN NEBU
5.0000 mg | INHALATION_SOLUTION | Freq: Once | RESPIRATORY_TRACT | Status: AC
  Administered 2015-09-24: 5 mg via RESPIRATORY_TRACT

## 2015-09-24 NOTE — ED Provider Notes (Signed)
CSN: [id number]     Arrival date & time 09/24/15  1653 History  By signing my name below, I, Gina Walker, attest that this documentation has been prepared under the direction and in the presence of Etta Quill, NP. Electronically Signed: Eustaquio Walker, ED Scribe. 09/24/2015. 5:46 PM.   Chief Complaint  Patient presents with  . Asthma   Patient is a 35 y.o. female presenting with asthma. The history is provided by the patient. No language interpreter was used.  Asthma This is a new problem. The current episode started 6 to 12 hours ago. The problem has not changed since onset.Associated symptoms include shortness of breath. Treatments tried: Albuterol inhaler. Nebulizer machine. The treatment provided mild relief.     HPI Comments: Gina Walker is a 35 y.o. female with PMHx asthma who presents to the Emergency Department complaining of exacerbation of asthma that began this morning. Pt complains of shortness of breath, wheezing, chills, nausea, and productive cough with white phlegm. Pt uses an inhaler and a nebulizer machine at home for her asthma. She used both today with some relief. Denies fever, vomiting, or any other associated symptoms.   Past Medical History  Diagnosis Date  . Allergic rhinitis   . Asthma   . HTN (hypertension)   . Fibroids   . Obesity 04/17/2013   Past Surgical History  Procedure Laterality Date  . Tonsillectomy    . Gum surgery    . Wisdom tooth extraction     Family History  Problem Relation Age of Onset  . Sleep apnea Father   . Colon cancer Maternal Grandmother   . Heart disease Maternal Grandmother   . Cancer Mother     breast  . Cancer Maternal Aunt     lung  . Clotting disorder Maternal Aunt   . Heart disease Paternal Grandfather    Social History  Substance Use Topics  . Smoking status: Never Smoker   . Smokeless tobacco: Never Used  . Alcohol Use: Yes     Comment: glass of wine 2x month   OB History    No data available      Review of Systems  Constitutional: Positive for chills. Negative for fever.  Respiratory: Positive for cough, shortness of breath and wheezing.   Gastrointestinal: Positive for nausea. Negative for vomiting.  All other systems reviewed and are negative.     Allergies  Cefuroxime axetil and Shellfish allergy  Home Medications   Prior to Admission medications   Medication Sig Start Date End Date Taking? Authorizing Provider  albuterol (PROAIR HFA) 108 (90 BASE) MCG/ACT inhaler Inhale 2 puffs into the lungs 4 (four) times daily as needed. 10/30/13   Tanda Rockers, MD  albuterol (PROVENTIL) (2.5 MG/3ML) 0.083% nebulizer solution Take 6 mLs (5 mg total) by nebulization every 6 (six) hours as needed for wheezing or shortness of breath. 08/05/13   Antonietta Breach, PA-C  budesonide-formoterol (SYMBICORT) 160-4.5 MCG/ACT inhaler Inhale 2 puffs into the lungs 2 (two) times daily.      Historical Provider, MD  ibuprofen (ADVIL,MOTRIN) 200 MG tablet Take 200 mg by mouth every 6 (six) hours as needed for moderate pain.    Historical Provider, MD  losartan-hydrochlorothiazide (HYZAAR) 100-12.5 MG per tablet Take 1 tablet by mouth daily.  12/07/12   Historical Provider, MD  montelukast (SINGULAIR) 10 MG tablet One at bedtime every night 10/30/13   Tanda Rockers, MD  SPRINTEC 28 0.25-35 MG-MCG tablet TAKE 1 TABLET DAILY 04/21/14  Estill Dooms, NP  tetrahydrozoline-zinc (VISINE-AC) 0.05-0.25 % ophthalmic solution 2 drops 3 (three) times daily as needed.    Historical Provider, MD  verapamil (CALAN) 120 MG tablet Take 120 mg by mouth daily.  12/07/12   Historical Provider, MD   BP 176/86 mmHg  Pulse 97  Temp(Src) 98.7 F (37.1 C) (Oral)  Resp 20  SpO2 99%   Physical Exam  Constitutional: She is oriented to person, place, and time. She appears well-developed and well-nourished. No distress.  HENT:  Head: Normocephalic and atraumatic.  Eyes: Conjunctivae and EOM are normal.  Neck: Neck supple. No  tracheal deviation present.  Cardiovascular: Regular rhythm.  Tachycardia present.   Pulmonary/Chest: Effort normal and breath sounds normal. No respiratory distress. She has no wheezes. She has no rales.  Musculoskeletal: Normal range of motion.  Neurological: She is alert and oriented to person, place, and time.  Skin: Skin is warm and dry.  Psychiatric: She has a normal mood and affect. Her behavior is normal.  Nursing note and vitals reviewed.   ED Course  Procedures (including critical care time)  DIAGNOSTIC STUDIES: Oxygen Saturation is 99% on RA, normal by my interpretation.    COORDINATION OF CARE: 5:44 PM-Discussed treatment plan which includes CXR with pt at bedside and pt agreed to plan.   Labs Review Labs Reviewed - No data to display  Imaging Review Dg Chest 2 View  09/24/2015  CLINICAL DATA:  Cough for 1 month. Wheezing for 2 weeks. Initial encounter. EXAM: CHEST  2 VIEW COMPARISON:  PA and lateral chest 08/05/2013. FINDINGS: The lungs are clear. Heart size is normal. No pneumothorax or pleural effusion. No bony abnormality. IMPRESSION: Negative chest. Electronically Signed   By: Inge Rise M.D.   On: 09/24/2015 18:29   I have personally reviewed and evaluated these images as part of my medical decision-making.   EKG Interpretation None     Peak flow of 410 (predicted 470). No current wheezing on exam. Afebrile. No acute findings on xray. MDM   Final diagnoses:  None  Patient with mild signs and symptoms of asthma/RAD. Oxygen saturation is above 90%. No accessory muscle use, no cyanosis. Treated in the ED with nebulized albuterol.  Patient feels improved after treatment. Will discharge with short course of prednisone. Pt instructed to follow up with PCP. Patient hemodynamically stable. Discussed return precautions. Appears safe for discharge.    I personally performed the services described in this documentation, which was scribed in my presence. The  recorded information has been reviewed and is accurate.      Etta Quill, NP 09/24/15 [place], MD 09/25/15 [phone]

## 2015-09-24 NOTE — Discharge Instructions (Signed)
Asthma, Adult Asthma is a recurring condition in which the airways tighten and narrow. Asthma can make it difficult to breathe. It can cause coughing, wheezing, and shortness of breath. Asthma episodes, also called asthma attacks, range from minor to life-threatening. Asthma cannot be cured, but medicines and lifestyle changes can help control it. CAUSES Asthma is believed to be caused by inherited (genetic) and environmental factors, but its exact cause is unknown. Asthma may be triggered by allergens, lung infections, or irritants in the air. Asthma triggers are different for each person. Common triggers include:   Animal dander.  Dust mites.  Cockroaches.  Pollen from trees or grass.  Mold.  Smoke.  Air pollutants such as dust, household cleaners, hair sprays, aerosol sprays, paint fumes, strong chemicals, or strong odors.  Cold air, weather changes, and winds (which increase molds and pollens in the air).  Strong emotional expressions such as crying or laughing hard.  Stress.  Certain medicines (such as aspirin) or types of drugs (such as beta-blockers).  Sulfites in foods and drinks. Foods and drinks that may contain sulfites include dried fruit, potato chips, and sparkling grape juice.  Infections or inflammatory conditions such as the flu, a cold, or an inflammation of the nasal membranes (rhinitis).  Gastroesophageal reflux disease (GERD).  Exercise or strenuous activity. SYMPTOMS Symptoms may occur immediately after asthma is triggered or many hours later. Symptoms include:  Wheezing.  Excessive nighttime or early morning coughing.  Frequent or severe coughing with a common cold.  Chest tightness.  Shortness of breath. DIAGNOSIS  The diagnosis of asthma is made by a review of your medical history and a physical exam. Tests may also be performed. These may include:  Lung function studies. These tests show how much air you breathe in and out.  Allergy  tests.  Imaging tests such as X-rays. TREATMENT  Asthma cannot be cured, but it can usually be controlled. Treatment involves identifying and avoiding your asthma triggers. It also involves medicines. There are 2 classes of medicine used for asthma treatment:   Controller medicines. These prevent asthma symptoms from occurring. They are usually taken every day.  Reliever or rescue medicines. These quickly relieve asthma symptoms. They are used as needed and provide short-term relief. Your health care provider will help you create an asthma action plan. An asthma action plan is a written plan for managing and treating your asthma attacks. It includes a list of your asthma triggers and how they may be avoided. It also includes information on when medicines should be taken and when their dosage should be changed. An action plan may also involve the use of a device called a peak flow meter. A peak flow meter measures how well the lungs are working. It helps you monitor your condition. HOME CARE INSTRUCTIONS   Take medicines only as directed by your health care provider. Speak with your health care provider if you have questions about how or when to take the medicines.  Use a peak flow meter as directed by your health care provider. Record and keep track of readings.  Understand and use the action plan to help minimize or stop an asthma attack without needing to seek medical care.  Control your home environment in the following ways to help prevent asthma attacks:  Do not smoke. Avoid being exposed to secondhand smoke.  Change your heating and air conditioning filter regularly.  Limit your use of fireplaces and wood stoves.  Get rid of pests (such as roaches   and mice) and their droppings.  Throw away plants if you see mold on them.  Clean your floors and dust regularly. Use unscented cleaning products.  Try to have someone else vacuum for you regularly. Stay out of rooms while they are  being vacuumed and for a short while afterward. If you vacuum, use a dust mask from a hardware store, a double-layered or microfilter vacuum cleaner bag, or a vacuum cleaner with a HEPA filter.  Replace carpet with wood, tile, or vinyl flooring. Carpet can trap dander and dust.  Use allergy-proof pillows, mattress covers, and box spring covers.  Wash bed sheets and blankets every week in hot water and dry them in a dryer.  Use blankets that are made of polyester or cotton.  Clean bathrooms and kitchens with bleach. If possible, have someone repaint the walls in these rooms with mold-resistant paint. Keep out of the rooms that are being cleaned and painted.  Wash hands frequently. SEEK MEDICAL CARE IF:   You have wheezing, shortness of breath, or a cough even if taking medicine to prevent attacks.  The colored mucus you cough up (sputum) is thicker than usual.  Your sputum changes from clear or white to yellow, green, gray, or bloody.  You have any problems that may be related to the medicines you are taking (such as a rash, itching, swelling, or trouble breathing).  You are using a reliever medicine more than 2-3 times per week.  Your peak flow is still at 50-79% of your personal best after following your action plan for 1 hour.  You have a fever. SEEK IMMEDIATE MEDICAL CARE IF:   You seem to be getting worse and are unresponsive to treatment during an asthma attack.  You are short of breath even at rest.  You get short of breath when doing very little physical activity.  You have difficulty eating, drinking, or talking due to asthma symptoms.  You develop chest pain.  You develop a fast heartbeat.  You have a bluish color to your lips or fingernails.  You are light-headed, dizzy, or faint.  Your peak flow is less than 50% of your personal best.   This information is not intended to replace advice given to you by your health care provider. Make sure you discuss any  questions you have with your health care provider.   Document Released: 06/13/2005 Document Revised: 03/04/2015 Document Reviewed: 01/10/2013 Elsevier Interactive Patient Education 2016 [employer]  

## 2015-09-24 NOTE — Progress Notes (Signed)
Patient performed peak flow 3 times with good effort achieving 410 at best with a predicted of 470 for her height and age.

## 2015-09-24 NOTE — ED Notes (Signed)
PT has asthma and states started having asthma attack this am and states her inhaler was not working.  Pt better now. Pt has no auscultated wheezes on arrival to triage adn talking in complete sentences

## 2015-11-23 ENCOUNTER — Emergency Department (HOSPITAL_COMMUNITY)
Admission: EM | Admit: 2015-11-23 | Discharge: 2015-11-24 | Disposition: A | Discharge status: Home / Self Care | Payer: 59 | Attending: Emergency Medicine | Admitting: Emergency Medicine

## 2015-11-23 ENCOUNTER — Encounter (HOSPITAL_COMMUNITY): Payer: Self-pay | Admitting: Emergency Medicine

## 2015-11-23 ENCOUNTER — Emergency Department (HOSPITAL_COMMUNITY): Payer: 59

## 2015-11-23 DIAGNOSIS — J45901 Unspecified asthma with (acute) exacerbation: Secondary | ICD-10-CM | POA: Diagnosis not present

## 2015-11-23 DIAGNOSIS — R000 Tachycardia, unspecified: Secondary | ICD-10-CM | POA: Insufficient documentation

## 2015-11-23 DIAGNOSIS — I10 Essential (primary) hypertension: Secondary | ICD-10-CM | POA: Insufficient documentation

## 2015-11-23 DIAGNOSIS — R0602 Shortness of breath: Secondary | ICD-10-CM | POA: Diagnosis present

## 2015-11-23 DIAGNOSIS — Z791 Long term (current) use of non-steroidal anti-inflammatories (NSAID): Secondary | ICD-10-CM | POA: Insufficient documentation

## 2015-11-23 DIAGNOSIS — E119 Type 2 diabetes mellitus without complications: Secondary | ICD-10-CM | POA: Insufficient documentation

## 2015-11-23 HISTORY — DX: Type 2 diabetes mellitus without complications: E11.9

## 2015-11-23 NOTE — ED Notes (Signed)
Patient complaining of shortness of breath since yesterday. States "I feel like I'm having asthma attacks and I'm using my nebulizer treatments but they aren't helping."

## 2015-11-24 LAB — BASIC METABOLIC PANEL
ANION GAP: 6 (ref 5–15)
BUN: 12 mg/dL (ref 6–20)
CALCIUM: 9.4 mg/dL (ref 8.9–10.3)
CO2: 30 mmol/L (ref 22–32)
Chloride: 98 mmol/L — ABNORMAL LOW (ref 101–111)
Creatinine, Ser: 1.16 mg/dL — ABNORMAL HIGH (ref 0.44–1.00)
GFR, EST NON AFRICAN AMERICAN: 60 mL/min — AB (ref >60)
Glucose, Bld: 97 mg/dL (ref 65–99)
Potassium: 3.3 mmol/L — ABNORMAL LOW (ref 3.5–5.1)
Sodium: 134 mmol/L — ABNORMAL LOW (ref 135–145)

## 2015-11-24 LAB — CBC WITH DIFFERENTIAL/PLATELET
BASOS ABS: 0 10*3/uL (ref 0.0–0.1)
BASOS PCT: 0 %
EOS PCT: 8 %
Eosinophils Absolute: 0.7 10*3/uL (ref 0.0–0.7)
HCT: 41.1 % (ref 36.0–46.0)
Hemoglobin: 13.8 g/dL (ref 12.0–15.0)
Lymphocytes Relative: 31 %
Lymphs Abs: 2.9 10*3/uL (ref 0.7–4.0)
MCH: 29.5 pg (ref 26.0–34.0)
MCHC: 33.6 g/dL (ref 30.0–36.0)
MCV: 87.8 fL (ref 78.0–100.0)
MONO ABS: 0.8 10*3/uL (ref 0.1–1.0)
Monocytes Relative: 9 %
NEUTROS ABS: 4.8 10*3/uL (ref 1.7–7.7)
Neutrophils Relative %: 52 %
PLATELETS: 319 10*3/uL (ref 150–400)
RBC: 4.68 MIL/uL (ref 3.87–5.11)
RDW: 13.5 % (ref 11.5–15.5)
WBC: 9.3 10*3/uL (ref 4.0–10.5)

## 2015-11-24 LAB — D-DIMER, QUANTITATIVE (NOT AT ARMC): D DIMER QUANT: 0.46 ug{FEU}/mL (ref 0.00–0.50)

## 2015-11-24 LAB — TROPONIN I: Troponin I: <0.03 ng/mL (ref <0.031)

## 2015-11-24 MED ORDER — IPRATROPIUM-ALBUTEROL 0.5-2.5 (3) MG/3ML IN SOLN
3.0000 mL | Freq: Once | RESPIRATORY_TRACT | Status: AC
  Administered 2015-11-24: 3 mL via RESPIRATORY_TRACT
  Filled 2015-11-24: qty 3

## 2015-11-24 MED ORDER — PREDNISONE 50 MG PO TABS
60.0000 mg | ORAL_TABLET | Freq: Once | ORAL | Status: AC
  Administered 2015-11-24: 60 mg via ORAL
  Filled 2015-11-24: qty 1

## 2015-11-24 MED ORDER — PREDNISONE 50 MG PO TABS: ORAL_TABLET | ORAL | Status: DC

## 2015-11-24 NOTE — ED Notes (Signed)
Patient ambulated around nurses station. Patients respirations and heart rate increased. Patient states that she feels a little short winded. Heart upon returning to room 120 to 126. Respirations at 24, O2 Sat read between 98-100. Upon resting for a few minutes heart rate is reading 106, respirations 20.

## 2015-11-24 NOTE — ED Provider Notes (Signed)
CSN: [id number]     Arrival date & time 11/23/15  2239 History  By signing my name below, I, Emmanuella Mensah, attest that this documentation has been prepared under the direction and in the presence of Ezequiel Essex, MD. Electronically Signed: Judithann Sauger, ED Scribe. 11/24/2015. 12:34 AM.      Chief Complaint  Patient presents with  . Shortness of Breath   The history is provided by the patient. No language interpreter was used.    HPI Comments: Gina Walker is a 35 y.o. female with a hx of asthma, HTN, and DM who presents to the Emergency Department complaining of gradually worsening SOB (worse on exertion) onset yesterday. She reports associated productive cough with yellow sputum yesterday. She adds that she has been having intermittent persistent cough onset November 2016 and she went to see her PCP 5 days ago who advised her to continue her daily Symbicort and Mucinex but her symptoms are still persisting. She states that she has used her albuterol 4 times today although she normally takes it as needed. She also explains that her Symbicort is at 0% after her last dose today. No other alleviating factors noted. She denies that she is a current smoker. She also denies any birth control use. 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 She denies any nasal congestion or other URI symptoms.     Past Medical History  Diagnosis Date  . Allergic rhinitis   . Asthma   . HTN (hypertension)   . Fibroids   . Obesity 04/17/2013  . Diabetes mellitus without complication [facility])    Past Surgical History  Procedure Laterality Date  . Tonsillectomy    . Gum surgery    . Wisdom tooth extraction     Family History  Problem Relation Age of Onset  . Sleep apnea Father   . Colon cancer Maternal Grandmother   . Heart disease Maternal Grandmother    . Cancer Mother     breast  . Cancer Maternal Aunt     lung  . Clotting disorder Maternal Aunt   . Heart disease Paternal Grandfather    Social History  Substance Use Topics  . Smoking status: Never Smoker   . Smokeless tobacco: Never Used  . Alcohol Use: Yes     Comment: glass of wine 2x month   OB History    No data available     Review of Systems    Allergies  Cefuroxime axetil; Shellfish allergy; and Omnicef  Home Medications   Prior to Admission medications   Medication Sig Start Date End Date Taking? Authorizing Provider  albuterol (PROAIR HFA) 108 (90 BASE) MCG/ACT inhaler Inhale 2 puffs into the lungs 4 (four) times daily as needed. 10/30/13   Tanda Rockers, MD  albuterol (PROVENTIL) (2.5 MG/3ML) 0.083% nebulizer solution Take 6 mLs (5 mg total) by nebulization every 6 (six) hours as needed for wheezing or shortness of breath. 08/05/13   Antonietta Breach, PA-C  budesonide-formoterol (SYMBICORT) 160-4.5 MCG/ACT inhaler Inhale 2 puffs into the lungs 2 (two) times daily.      Historical Provider, MD  ibuprofen (ADVIL,MOTRIN) 200 MG tablet Take 200 mg by mouth every 6 (six) hours as needed for moderate pain.    Historical Provider, MD  losartan-hydrochlorothiazide (HYZAAR) 100-12.5 MG per tablet Take 1 tablet by mouth daily.  12/07/12  Historical Provider, MD  montelukast (SINGULAIR) 10 MG tablet One at bedtime every night 10/30/13   Tanda Rockers, MD  predniSONE (DELTASONE) 50 MG tablet 1 tablet PO daily 11/24/15   Ezequiel Essex, MD  SPRINTEC 28 0.25-35 MG-MCG tablet TAKE 1 TABLET DAILY 04/21/14   Estill Dooms, NP  tetrahydrozoline-zinc (VISINE-AC) 0.05-0.25 % ophthalmic solution 2 drops 3 (three) times daily as needed.    Historical Provider, MD  verapamil (CALAN) 120 MG tablet Take 120 mg by mouth daily.  12/07/12   Historical Provider, MD   BP 138/92 mmHg  Pulse 109  Temp(Src) 99.2 F (37.3 C) (Temporal)  Resp 25  Ht 5\' 4"  (1.626 m)  Wt 260 lb (117.935 kg)  BMI  44.61 kg/m2  SpO2 95%  LMP 11/15/2015 Physical Exam  Constitutional: She is oriented to person, place, and time. She appears well-developed and well-nourished. No distress.  HENT:  Head: Normocephalic and atraumatic.  Mouth/Throat: Oropharynx is clear and moist. No oropharyngeal exudate.  Eyes: Conjunctivae and EOM are normal. Pupils are equal, round, and reactive to light.  Neck: Normal range of motion. Neck supple.  No meningismus.  Cardiovascular: Regular rhythm, normal heart sounds and intact distal pulses.  Tachycardia present.   No murmur heard. Pulmonary/Chest: Effort normal. No respiratory distress. She has wheezes.  Dry cough Expiratory wheezes with fair air exchange  Abdominal: Soft. There is no tenderness. There is no rebound and no guarding.  Musculoskeletal: Normal range of motion. She exhibits no edema or tenderness.  Neurological: She is alert and oriented to person, place, and time. No cranial nerve deficit. She exhibits normal muscle tone. Coordination normal.  No ataxia on finger to nose bilaterally. No pronator drift. 5/5 strength throughout. CN 2-12 intact.Equal grip strength. Sensation intact.   Skin: Skin is warm.  Psychiatric: She has a normal mood and affect. Her behavior is normal.  Nursing note and vitals reviewed.   ED Course  Procedures (including critical care time) DIAGNOSTIC STUDIES: Oxygen Saturation is 100% on RA, normal by my interpretation.    COORDINATION OF CARE: 12:32 AM- Pt advised of plan for treatment and pt agrees. Pt will receive chest x-ray and lab work for further evaluation. She will also receive duoneb treatment and prednisone.    Labs Review Labs Reviewed  BASIC METABOLIC PANEL - Abnormal; Notable for the following:    Sodium 134 (*)    Potassium 3.3 (*)    Chloride 98 (*)    Creatinine, Ser 1.16 (*)    GFR calc non Af Amer 60 (*)    All other components within normal limits  CBC WITH DIFFERENTIAL/PLATELET  TROPONIN I   D-DIMER, QUANTITATIVE (NOT AT [facility])    Imaging Review Dg Chest 2 View  11/23/2015  CLINICAL DATA:  Shortness of breath since yesterday. Nonproductive cough for months. EXAM: CHEST  2 VIEW COMPARISON:  09/24/2015 FINDINGS: The cardiomediastinal contours are normal. Equivocal bronchial thickening. Pulmonary vasculature is normal. No consolidation, pleural effusion, or pneumothorax. No acute osseous abnormalities are seen. Scoliosis in the thoracic spine. IMPRESSION: Equivocal bronchial thickening.  No focal abnormality. Electronically Signed   By: Jeb Levering M.D.   On: 11/23/2015 23:26   Ezequiel Essex, MD has personally reviewed and evaluated these images and lab results as part of his medical decision-making.   EKG Interpretation   Date/Time:  Tuesday Nov 24 2015 00:47:12 EDT Ventricular Rate:  100 PR Interval:  144 QRS Duration: 77 QT Interval:  407 QTC Calculation: 525 R  Axis:   25 Text Interpretation:  Sinus tachycardia Abnormal R-wave progression, early  transition Nonspecific T abnormalities, lateral leads Prolonged QT  interval No significant change was found Confirmed by Wyvonnia Dusky  MD, Maziah Smola  [phone]) on 11/24/2015 1:08:40 AM      MDM   Final diagnoses:  Asthma exacerbation  Patient with history of asthma presenting with shortness of breath since yesterday. Some chest tightness with coughing. No fever. Use nebulizer 4 times today without relief.  Decreased air movement on exam with wheezing. Nebulizers and steroids given. Chest x-rays negative for pneumonia. CXR negative. D-dimer negative.  Work of breathing improved after 2 nebulizers. Patient ambulatory without desaturation. No chest pain.  Treat for asthma exacerbation. She has her nebulizer and inhalers at home. She states she needs to refill her Symbicort and Singulair but does have prescriptions. Steroid course provided. Follow up with PCP, return precautions discussed.  I personally performed the services  described in this documentation, which was scribed in my presence. The recorded information has been reviewed and is accurate.   Ezequiel Essex, MD 11/24/15 [phone]

## 2015-11-24 NOTE — Discharge Instructions (Signed)
Asthma, Adult Asthma is a recurring condition in which the airways tighten and narrow. Asthma can make it difficult to breathe. It can cause coughing, wheezing, and shortness of breath. Asthma episodes, also called asthma attacks, range from minor to life-threatening. Asthma cannot be cured, but medicines and lifestyle changes can help control it. CAUSES Asthma is believed to be caused by inherited (genetic) and environmental factors, but its exact cause is unknown. Asthma may be triggered by allergens, lung infections, or irritants in the air. Asthma triggers are different for each person. Common triggers include:   Animal dander.  Dust mites.  Cockroaches.  Pollen from trees or grass.  Mold.  Smoke.  Air pollutants such as dust, household cleaners, hair sprays, aerosol sprays, paint fumes, strong chemicals, or strong odors.  Cold air, weather changes, and winds (which increase molds and pollens in the air).  Strong emotional expressions such as crying or laughing hard.  Stress.  Certain medicines (such as aspirin) or types of drugs (such as beta-blockers).  Sulfites in foods and drinks. Foods and drinks that may contain sulfites include dried fruit, potato chips, and sparkling grape juice.  Infections or inflammatory conditions such as the flu, a cold, or an inflammation of the nasal membranes (rhinitis).  Gastroesophageal reflux disease (GERD).  Exercise or strenuous activity. SYMPTOMS Symptoms may occur immediately after asthma is triggered or many hours later. Symptoms include:  Wheezing.  Excessive nighttime or early morning coughing.  Frequent or severe coughing with a common cold.  Chest tightness.  Shortness of breath. DIAGNOSIS  The diagnosis of asthma is made by a review of your medical history and a physical exam. Tests may also be performed. These may include:  Lung function studies. These tests show how much air you breathe in and out.  Allergy  tests.  Imaging tests such as X-rays. TREATMENT  Asthma cannot be cured, but it can usually be controlled. Treatment involves identifying and avoiding your asthma triggers. It also involves medicines. There are 2 classes of medicine used for asthma treatment:   Controller medicines. These prevent asthma symptoms from occurring. They are usually taken every day.  Reliever or rescue medicines. These quickly relieve asthma symptoms. They are used as needed and provide short-term relief. Your health care provider will help you create an asthma action plan. An asthma action plan is a written plan for managing and treating your asthma attacks. It includes a list of your asthma triggers and how they may be avoided. It also includes information on when medicines should be taken and when their dosage should be changed. An action plan may also involve the use of a device called a peak flow meter. A peak flow meter measures how well the lungs are working. It helps you monitor your condition. HOME CARE INSTRUCTIONS   Take medicines only as directed by your health care provider. Speak with your health care provider if you have questions about how or when to take the medicines.  Use a peak flow meter as directed by your health care provider. Record and keep track of readings.  Understand and use the action plan to help minimize or stop an asthma attack without needing to seek medical care.  Control your home environment in the following ways to help prevent asthma attacks:  Do not smoke. Avoid being exposed to secondhand smoke.  Change your heating and air conditioning filter regularly.  Limit your use of fireplaces and wood stoves.  Get rid of pests (such as roaches   and mice) and their droppings.  Throw away plants if you see mold on them.  Clean your floors and dust regularly. Use unscented cleaning products.  Try to have someone else vacuum for you regularly. Stay out of rooms while they are  being vacuumed and for a short while afterward. If you vacuum, use a dust mask from a hardware store, a double-layered or microfilter vacuum cleaner bag, or a vacuum cleaner with a HEPA filter.  Replace carpet with wood, tile, or vinyl flooring. Carpet can trap dander and dust.  Use allergy-proof pillows, mattress covers, and box spring covers.  Wash bed sheets and blankets every week in hot water and dry them in a dryer.  Use blankets that are made of polyester or cotton.  Clean bathrooms and kitchens with bleach. If possible, have someone repaint the walls in these rooms with mold-resistant paint. Keep out of the rooms that are being cleaned and painted.  Wash hands frequently. SEEK MEDICAL CARE IF:   You have wheezing, shortness of breath, or a cough even if taking medicine to prevent attacks.  The colored mucus you cough up (sputum) is thicker than usual.  Your sputum changes from clear or white to yellow, green, gray, or bloody.  You have any problems that may be related to the medicines you are taking (such as a rash, itching, swelling, or trouble breathing).  You are using a reliever medicine more than 2-3 times per week.  Your peak flow is still at 50-79% of your personal best after following your action plan for 1 hour.  You have a fever. SEEK IMMEDIATE MEDICAL CARE IF:   You seem to be getting worse and are unresponsive to treatment during an asthma attack.  You are short of breath even at rest.  You get short of breath when doing very little physical activity.  You have difficulty eating, drinking, or talking due to asthma symptoms.  You develop chest pain.  You develop a fast heartbeat.  You have a bluish color to your lips or fingernails.  You are light-headed, dizzy, or faint.  Your peak flow is less than 50% of your personal best.   This information is not intended to replace advice given to you by your health care provider. Make sure you discuss any  questions you have with your health care provider.   Document Released: 06/13/2005 Document Revised: 03/04/2015 Document Reviewed: 01/10/2013 Elsevier Interactive Patient Education 2016 [employer]  

## 2015-12-16 ENCOUNTER — Ambulatory Visit (INDEPENDENT_AMBULATORY_CARE_PROVIDER_SITE_OTHER): Payer: 59 | Admitting: Internal Medicine

## 2015-12-16 ENCOUNTER — Encounter: Payer: Self-pay | Admitting: Internal Medicine

## 2015-12-16 ENCOUNTER — Other Ambulatory Visit (INDEPENDENT_AMBULATORY_CARE_PROVIDER_SITE_OTHER): Payer: 59

## 2015-12-16 VITALS — BP 128/78 | HR 117 | Ht 64.0 in | Wt 263.0 lb

## 2015-12-16 DIAGNOSIS — J4530 Mild persistent asthma, uncomplicated: Secondary | ICD-10-CM | POA: Insufficient documentation

## 2015-12-16 LAB — CBC WITH DIFFERENTIAL/PLATELET
BASOS PCT: 0.5 % (ref 0.0–3.0)
Basophils Absolute: 0 10*3/uL (ref 0.0–0.1)
EOS PCT: 3.4 % (ref 0.0–5.0)
Eosinophils Absolute: 0.3 10*3/uL (ref 0.0–0.7)
HEMATOCRIT: 40.1 % (ref 36.0–46.0)
HEMOGLOBIN: 13.4 g/dL (ref 12.0–15.0)
Lymphocytes Relative: 28.5 % (ref 12.0–46.0)
Lymphs Abs: 2.3 10*3/uL (ref 0.7–4.0)
MCHC: 33.3 g/dL (ref 30.0–36.0)
MCV: 86.2 fl (ref 78.0–100.0)
MONO ABS: 0.7 10*3/uL (ref 0.1–1.0)
MONOS PCT: 8.4 % (ref 3.0–12.0)
NEUTROS PCT: 59.2 % (ref 43.0–77.0)
Neutro Abs: 4.7 10*3/uL (ref 1.4–7.7)
Platelets: 307 10*3/uL (ref 150.0–400.0)
RBC: 4.65 Mil/uL (ref 3.87–5.11)
RDW: 14.6 % (ref 11.5–15.5)
WBC: 7.9 10*3/uL (ref 4.0–10.5)

## 2015-12-16 LAB — NITRIC OXIDE: Nitric Oxide: 34

## 2015-12-16 MED ORDER — BUDESONIDE-FORMOTEROL FUMARATE 80-4.5 MCG/ACT IN AERO: INHALATION_SPRAY | RESPIRATORY_TRACT | Status: DC

## 2015-12-16 MED ORDER — PANTOPRAZOLE SODIUM 40 MG PO TBEC: 40.0000 mg | DELAYED_RELEASE_TABLET | Freq: Every day | ORAL | Status: DC

## 2015-12-16 MED ORDER — FAMOTIDINE 20 MG PO TABS: ORAL_TABLET | ORAL | Status: DC

## 2015-12-16 NOTE — Progress Notes (Signed)
Subjective:    Patient ID: Gina Walker, female    DOB: [birth year]  MRN: [id number]    Brief patient profile:  5 yobf CNA with bad asthma age 35 intermittent meds but persistent symptoms with exertion but seemed better age 31-17 then after that had more of an intermittent symptoms of sob/ chest tightness more in spring then around 2011/12 more of a chronic problem with freq asthma attacks requiring more albuterol much better p started symbicort around 2013 but bad sob with flu > ER 08/05/13 self referred 09/18/2013 to pulmonary clinic no really better since the flu with constant daytime coughing > sob.   History of Present Illnes 09/18/2013 1st [facility] office visit/ Gina Walker Chief Complaint  Patient presents with  . Pulmonary Consult    Self referral for SOB   on symbicort 160 2bid and no saba x one week, ok hfa, mostly concerned with throat congestion and chest "congestion" but no purulent sputum, worse day than noct, assoc with sense of pnds. Onset was abuptly worse with flu like illness in early feb 2015 but no longer has any flu-like symptoms rec Pneumovax today  Plan A = automatic= add singulair 10 mg one each pm and Continue symbicort 160 Take 2 puffs first thing in am and then another 2 puffs about 12 hours later.  Plan B = backup  Only use your albuterol (ventolin)as a rescue medication   - Albuterol neb up 4 hours if not better with ventolin BCPs may be need to be modified with less progesterone GERD diet    10/30/2013 f/u ov/Gina Walker re: singulair/symbicort 160 2bid  Chief Complaint  Patient presents with  . Follow-up    Pt reports that her breathing is back to normal baseline with needs for rescue inhaler only once since her last visit.    Not limited by breathing from desired activities   rec No changes in medications x 3 month Please schedule a follow up visit in 3 months but call sooner if needed    12/16/2015  Extended post ER f/u ov/Gina Walker re: dtc astham/cough   Chief Complaint  Patient presents with  . Follow-up    Pt c/o several recent asthma flares, one this morning. Pt reports that she did use her albuterol HFA  this morning with no symptom relief. Pt has been in the ED several times in the past few months. Pt was given oral pred and did improve, but symptoms return once stopped.   last prednisone was 11/29/15 and even on prednisone continued coughing esp while talking on the phone / generally problems are daytime/ not keeping her up at noct  In retrospect needed more albtuterol since November 2016 except for when on prednisone rx and even that did not really help the cough /  started back on maint rx with symbicort 160 but not taking consistently until April 2017    No obvious day to day or daytime variabilty or assoc chronic cough or cp or chest tightness, subjective wheeze overt sinus or hb symptoms. No unusual exp hx or h/o childhood pna/ asthma or knowledge of premature birth.  Sleeping ok without nocturnal  or early am exacerbation  of respiratory  c/o's or need for noct saba. Also denies any obvious fluctuation of symptoms with weather or environmental changes or other aggravating or alleviating factors except as outlined above   Current Medications, Allergies, Complete Past Medical History, Past Surgical History, Family History, and Social History were reviewed in [employer]  medical record.  ROS  The following are not active complaints unless bolded sore throat, dysphagia, dental problems, itching, sneezing,  nasal congestion or excess/ purulent secretions, ear ache,   fever, chills, sweats, unintended wt loss, pleuritic or exertional cp, hemoptysis,  orthopnea pnd or leg swelling, presyncope, palpitations, heartburn, abdominal pain, anorexia, nausea, vomiting, diarrhea  or change in bowel or urinary habits, change in stools or urine, dysuria,hematuria,  rash, arthralgias, visual complaints, headache, numbness weakness or ataxia or  problems with walking or coordination,  change in mood/affect or memory.             Objective:   Physical Exam  amb bf / mod hoarse/ vital signs reviewed last saba > 4 h and feeling she needs another now   10/30/2013          258  > 12/16/2015  263     09/18/13 251 lb (113.853 kg)  08/05/13 252 lb (114.306 kg)  04/17/13 251 lb (113.853 kg)      HEENT: nl dentition, turbinates, and orophanx. Nl external ear canals without cough reflex   NECK :  without JVD/Nodes/TM/ nl carotid upstrokes bilaterally   LUNGS: no acc muscle use, clear to A and P bilaterally without cough on insp or exp maneuvers   CV:  RRR  no s3 or murmur or increase in P2, no edema   ABD:  soft and nontender with nl excursion in the supine position. No bruits or organomegaly, bowel sounds nl  MS:  warm without deformities, calf tenderness, cyanosis or clubbing  SKIN: warm and dry without lesions    NEURO:  alert, approp, no deficits                Assessment & Plan:

## 2015-12-16 NOTE — Patient Instructions (Addendum)
Plan A = Automatic = Symbicort 80 Take 2 puffs first thing in am and then another 2 puffs about 12 hours later                                     Continue singulair each pm                                      Add Pantoprazole (protonix) 40 mg   Take  30-60 min before first meal of the day and Pepcid (famotidine)  20 mg one @  bedtime until return to office - this is the best way to tell whether stomach acid is contributing to your problem.    Work on inhaler technique:  relax and gently blow all the way out then take a nice smooth deep breath back in, triggering the inhaler at same time you start breathing in.  Hold for up to 5 seconds if you can. Blow out thru nose. Rinse and gargle with water when done        Plan B = Backup Only use your albuterol as a rescue medication to be used if you can't catch your breath by resting or doing a relaxed purse lip breathing pattern.  - The less you use it, the better it will work when you need it. - Ok to use the inhaler up to 2 puffs  every 4 hours if you must but call for appointment if use goes up over your usual need - Don't leave home without it !!  (think of it like the spare tire for your car)    Please remember to go to the lab   department downstairs for your tests - we will call you with the results when they are available.    Please schedule a follow up office visit in 4 weeks, sooner if needed

## 2015-12-17 LAB — RESPIRATORY ALLERGY PROFILE REGION II ~~LOC~~
Allergen, Comm Silver Birch, t9: <0.1 kU/L
Allergen, Cottonwood, t14: <0.1 kU/L
Allergen, D pternoyssinus,d7: <0.1 kU/L
Allergen, Mulberry, t76: <0.1 kU/L
Aspergillus fumigatus, m3: <0.1 kU/L
Bermuda Grass: <0.1 kU/L
Cladosporium Herbarum: <0.1 kU/L
Cockroach: <0.1 kU/L
Common Ragweed: <0.1 kU/L
D. farinae: <0.1 kU/L
Dog Dander: <0.1 kU/L
Elm IgE: <0.1 kU/L
IGE (IMMUNOGLOBULIN E), SERUM: 121 kU/L — AB (ref <115)
Johnson Grass: <0.1 kU/L
Pecan/Hickory Tree IgE: <0.1 kU/L
Penicillium Notatum: <0.1 kU/L
Timothy Grass: <0.1 kU/L

## 2015-12-17 NOTE — Assessment & Plan Note (Addendum)
-    NO  12/16/2015 = 33 with active symptoms  - Spirometry 12/16/2015  wnl  including fef 25-74with active symptoms - 12/16/2015  After extensive coaching HFA effectiveness =    75% > try reduce symbicort to 80 2 bid    DTC asthma/ cough with 2 trips to ER since last ov  DDX of  difficult airways management almost all start with A and  include Adherence, Ace Inhibitors, Acid Reflux, Active Sinus Disease, Alpha 1 Antitripsin deficiency, Anxiety masquerading as Airways dz,  ABPA,  Allergy(esp in young), Aspiration (esp in elderly), Adverse effects of meds,  Active smokers, A bunch of PE's (a small clot burden can't cause this syndrome unless there is already severe underlying pulm or vascular dz with poor reserve) plus two Bs  = Bronchiectasis and Beta blocker use..and one C= CHF    Adherence is always the initial "prime suspect" and is a multilayered concern that requires a "trust but verify" approach in every patient - starting with knowing how to use medications, especially inhalers, correctly, keeping up with refills and understanding the fundamental difference between maintenance and prns vs those medications only taken for a very short course and then stopped and not refilled.  - - The proper method of use, as well as anticipated side effects, of a metered-dose inhaler are discussed and demonstrated to the patient. Improved effectiveness after extensive coaching during this visit to a level of approximately 75 % from a baseline of 50 % > asthma component should be controllable on a lower dose of ics that will be less likely to trigger cough  ? Allergy > continue singulair/  Check allergy profile/eos off pred x 2 weeks  ? Acid (or non-acid) GERD > always difficult to exclude as up to 75% of pts in some series report no assoc GI/ Heartburn symptoms> rec max (24h)  acid suppression and diet restrictions/ reviewed and instructions given in writing.   ? ACEi effect from generic losartan: For reasons that  may related to vascular permability and nitric oxide pathways but not elevated  bradykinin levels (as seen with  ACEi use) losartan in the generic form has been reported now from mulitple sources  to cause a similar pattern of non-specific  upper airway symptoms as seen with acei.   This has not been reported with exposure to the other ARB's to date, so may need to try either generic diovan or avapro if ARB needed or use an alternative class altogether.  See:  Lelon Frohlich Allergy Asthma Immunol  2008: 101: p [phone]     I had an extended discussion with the patient reviewing all relevant studies completed to date and  lasting 25 minutes of a 40  minute extended ov to re-establish and sort out why using ER for an outpt problem we could handle/address here more effectively    Each maintenance medication was reviewed in detail including most importantly the difference between maintenance and prns and under what circumstances the prns are to be triggered using an action plan format that is not reflected in the computer generated alphabetically organized AVS.    Please see instructions for details which were reviewed in writing and the patient given a copy highlighting the part that I personally wrote and discussed at today's ov.

## 2015-12-17 NOTE — Assessment & Plan Note (Signed)
Body mass index is 45.12    No results found for: TSH   Contributing to gerd tendency/ doe/reviewed the need and the process to achieve and maintain neg calorie balance > defer f/u primary care including intermittently monitoring thyroid status   

## 2015-12-18 NOTE — Progress Notes (Signed)
Quick Note:  LMTCB ______ 

## 2015-12-23 NOTE — Progress Notes (Signed)
Quick Note:  LMTCB ______ 

## 2015-12-24 ENCOUNTER — Telehealth: Payer: Self-pay | Admitting: Internal Medicine

## 2015-12-24 NOTE — Telephone Encounter (Signed)
lmtcb x1 

## 2015-12-24 NOTE — Progress Notes (Signed)
Quick Note:  lmtcb ______ 

## 2015-12-24 NOTE — Telephone Encounter (Signed)
Pt will call back tomorrow

## 2015-12-25 NOTE — Telephone Encounter (Signed)
Notes Recorded by Rosana Berger, CMA on 12/24/2015 at 9:46 AM lmtcb Notes Recorded by Rosana Berger, CMA on 12/23/2015 at 12:17 PM LMTCB Notes Recorded by Rosana Berger, CMA on 12/18/2015 at 10:53 AM LMTCB Notes Recorded by Tanda Rockers, MD on 12/17/2015 at 5:18 PM Call patient : Studies are unremarkable, no evidence of a resp allergy ----------------------------------------------------- Spoke with pt. She is aware of results. Nothing further was needed.

## 2016-01-14 ENCOUNTER — Ambulatory Visit (INDEPENDENT_AMBULATORY_CARE_PROVIDER_SITE_OTHER): Payer: 59 | Admitting: Internal Medicine

## 2016-01-14 ENCOUNTER — Encounter: Payer: Self-pay | Admitting: Internal Medicine

## 2016-01-14 VITALS — BP 128/90 | HR 83 | Ht 64.0 in | Wt 267.0 lb

## 2016-01-14 DIAGNOSIS — J4530 Mild persistent asthma, uncomplicated: Secondary | ICD-10-CM | POA: Diagnosis not present

## 2016-01-14 LAB — NITRIC OXIDE: Nitric Oxide: 31

## 2016-01-14 MED ORDER — BUDESONIDE-FORMOTEROL FUMARATE 80-4.5 MCG/ACT IN AERO: INHALATION_SPRAY | RESPIRATORY_TRACT | Status: DC

## 2016-01-14 NOTE — Patient Instructions (Addendum)
Pantoprazole (protonix) 40 mg   Take  30-60 min before first meal of the day and  Add Pepcid (famotidine)  20 mg one @  bedtime whenever night time or early am wheeze/ cough   Continue symbicort 80 Take 2 puffs first thing in am and then another 2 puffs about 12 hours later.  Work on inhaler technique:  relax and gently blow all the way out then take a nice smooth deep breath back in, triggering the inhaler at same time you start breathing in.  Hold for up to 5 seconds if you can. Blow out thru nose. Rinse and gargle with water when done    Please schedule a follow up visit in 3 months but call sooner if needed

## 2016-01-14 NOTE — Progress Notes (Signed)
Subjective:    Patient ID: Gina Walker, female    DOB: Jul 09, 1980  MRN: [id number]    Brief patient profile:  35 yobf CNA with bad asthma age 35 intermittent meds but persistent symptoms with exertion but seemed better age [age] then after that had more of an intermittent symptoms of sob/ chest tightness more in spring then around 2011/12 more of a chronic problem with freq asthma attacks requiring more albuterol much better p started symbicort around 2013 but bad sob with flu > ER 08/05/13 self referred 09/18/2013 to pulmonary clinic no really better since the flu with constant daytime coughing > sob.   History of Present Illnes 09/18/2013 1st [facility] office visit/ Gina Walker Chief Complaint  Patient presents with  . Pulmonary Consult    Self referral for SOB   on symbicort 160 2bid and no saba x one week, ok hfa, mostly concerned with throat congestion and chest "congestion" but no purulent sputum, worse day than noct, assoc with sense of pnds. Onset was abuptly worse with flu like illness in early feb 2015 but no longer has any flu-like symptoms rec Pneumovax today  Plan A = automatic= add singulair 10 mg one each pm and Continue symbicort 160 Take 2 puffs first thing in am and then another 2 puffs about 12 hours later.  Plan B = backup  Only use your albuterol (ventolin)as a rescue medication   - Albuterol neb up 4 hours if not better with ventolin BCPs may be need to be modified with less progesterone GERD diet    12/16/2015  Extended post ER f/u ov/Gina Walker re: dtc asthma/cough  Chief Complaint  Patient presents with  . Follow-up    Pt c/o several recent asthma flares, one this morning. Pt reports that she did use her albuterol HFA  this morning with no symptom relief. Pt has been in the ED several times in the past few months. Pt was given oral pred and did improve, but symptoms return once stopped.   last prednisone was 11/29/15 and even on prednisone continued coughing esp  while talking on the phone / generally problems are daytime/ not keeping her up at noct  In retrospect needed more albtuterol since November 2016 except for when on prednisone rx and even that did not really help the cough /  started back on maint rx with symbicort 160 but not taking consistently until April 2017  rec Plan A = Automatic = Symbicort 80 Take 2 puffs first thing in am and then another 2 puffs about 12 hours later                                     Continue singulair each pm                                      Add Pantoprazole (protonix) 40 mg   Take  30-60 min before first meal of the day and Pepcid (famotidine)  20 mg one @  bedtime until return to office - this is the best way to tell whether stomach acid is contributing to your problem.   Work on inhaler technique:      Plan B = Backup Only use your albuterol as a rescue medication     01/14/2016  f/u ov/Gina Walker re:  Asthma/ symb  80 2bid / singulair / ppi qam only  Chief Complaint  Patient presents with  . Follow-up    Breathing is much improved. She had minimal wheezing yesterday.  She is rarely using proair and has not needed neb since her last visit.   no noct wheeze/ some wheeze with cold to hot/humid weather but overall doing well and min saba use    No obvious day to day or daytime variabilty or assoc chronic cough or cp or chest tightness, subjective wheeze overt sinus or hb symptoms. No unusual exp hx or h/o childhood pna/ asthma or knowledge of premature birth.  Sleeping ok without nocturnal  or early am exacerbation  of respiratory  c/o's or need for noct saba. Also denies any obvious fluctuation of symptoms with weather or environmental changes or other aggravating or alleviating factors except as outlined above   Current Medications, Allergies, Complete Past Medical History, Past Surgical History, Family History, and Social History were reviewed in [employer] record.  ROS  The following are  not active complaints unless bolded sore throat, dysphagia, dental problems, itching, sneezing,  nasal congestion or excess/ purulent secretions, ear ache,   fever, chills, sweats, unintended wt loss, pleuritic or exertional cp, hemoptysis,  orthopnea pnd or leg swelling, presyncope, palpitations, heartburn, abdominal pain, anorexia, nausea, vomiting, diarrhea  or change in bowel or urinary habits, change in stools or urine, dysuria,hematuria,  rash, arthralgias, visual complaints, headache, numbness weakness or ataxia or problems with walking or coordination,  change in mood/affect or memory.             Objective:   Physical Exam  amb bf nad    10/30/2013          258  > 12/16/2015  263 > 01/14/2016   267    09/18/13 251 lb (113.853 kg)  08/05/13 252 lb (114.306 kg)  04/17/13 251 lb (113.853 kg)      HEENT: nl dentition, turbinates, and orophanx. Nl external ear canals without cough reflex   NECK :  without JVD/Nodes/TM/ nl carotid upstrokes bilaterally   LUNGS: no acc muscle use, clear to A and P bilaterally without cough on insp or exp maneuvers   CV:  RRR  no s3 or murmur or increase in P2, no edema   ABD:  soft and nontender with nl excursion in the supine position. No bruits or organomegaly, bowel sounds nl  MS:  warm without deformities, calf tenderness, cyanosis or clubbing  SKIN: warm and dry without lesions    NEURO:  alert, approp, no deficits           Assessment & Plan:

## 2016-01-15 NOTE — Assessment & Plan Note (Signed)
Allergy profile 12/16/2015 >  Eos 0.3 /  IgE  121 neg RAST -  NO  12/16/2015 = 33 with active symptoms on symb160  - Spirometry 12/16/2015  wnl including fef 25-75 with active symptoms  - 12/16/2015  After extensive coaching HFA effectiveness =    75% > try reduce symbicort to 80 2 bid   - FENO 01/14/2016  =   30 on symb 80 2bid/ singulair  - The proper method of use, as well as anticipated side effects, of a metered-dose inhaler are discussed and demonstrated to the patient. Improved effectiveness after extensive coaching during this visit to a level of approximately 90 % from a baseline of < 75 %     Clearly less has proved to be more here with improved symptoms on the 80 2bid used more effectively with less upper airway irritability mimicking asthma symptoms  I had an extended discussion with the patient reviewing all relevant studies completed to date and  lasting 15 to 20 minutes of a 25 minute visit on the following ongoing concerns:   All goals of chronic asthma control met including optimal function and elimination of symptoms with minimal need for rescue therapy.  Contingencies discussed in full including contacting this office immediately if not controlling the symptoms using the rule of two's.     Each maintenance medication was reviewed in detail including most importantly the difference between maintenance and as needed and under what circumstances the prns are to be used.  Please see instructions for details which were reviewed in writing and the patient given a copy.

## 2016-01-19 ENCOUNTER — Other Ambulatory Visit (HOSPITAL_COMMUNITY): Payer: Self-pay | Admitting: Internal Medicine

## 2016-01-20 ENCOUNTER — Other Ambulatory Visit (HOSPITAL_COMMUNITY): Payer: Self-pay | Admitting: Internal Medicine

## 2016-01-20 DIAGNOSIS — R748 Abnormal levels of other serum enzymes: Secondary | ICD-10-CM

## 2016-04-19 ENCOUNTER — Encounter: Payer: 59 | Admitting: Internal Medicine

## 2017-01-16 ENCOUNTER — Emergency Department (HOSPITAL_COMMUNITY): Payer: 59

## 2017-01-16 ENCOUNTER — Encounter (HOSPITAL_COMMUNITY): Payer: Self-pay

## 2017-01-16 ENCOUNTER — Other Ambulatory Visit: Payer: Self-pay

## 2017-01-16 ENCOUNTER — Emergency Department (HOSPITAL_COMMUNITY)
Admission: EM | Admit: 2017-01-16 | Discharge: 2017-01-17 | Disposition: A | Discharge status: Home / Self Care | Payer: 59 | Attending: Emergency Medicine | Admitting: Emergency Medicine

## 2017-01-16 DIAGNOSIS — J45901 Unspecified asthma with (acute) exacerbation: Secondary | ICD-10-CM | POA: Insufficient documentation

## 2017-01-16 DIAGNOSIS — Z7984 Long term (current) use of oral hypoglycemic drugs: Secondary | ICD-10-CM | POA: Insufficient documentation

## 2017-01-16 DIAGNOSIS — I10 Essential (primary) hypertension: Secondary | ICD-10-CM | POA: Diagnosis not present

## 2017-01-16 DIAGNOSIS — R079 Chest pain, unspecified: Secondary | ICD-10-CM | POA: Diagnosis present

## 2017-01-16 DIAGNOSIS — Z79899 Other long term (current) drug therapy: Secondary | ICD-10-CM | POA: Diagnosis not present

## 2017-01-16 DIAGNOSIS — E119 Type 2 diabetes mellitus without complications: Secondary | ICD-10-CM | POA: Insufficient documentation

## 2017-01-16 DIAGNOSIS — R600 Localized edema: Secondary | ICD-10-CM | POA: Diagnosis not present

## 2017-01-16 MED ORDER — ALBUTEROL SULFATE (2.5 MG/3ML) 0.083% IN NEBU
5.0000 mg | INHALATION_SOLUTION | Freq: Once | RESPIRATORY_TRACT | Status: AC
  Administered 2017-01-16: 5 mg via RESPIRATORY_TRACT
  Filled 2017-01-16: qty 6

## 2017-01-16 NOTE — ED Triage Notes (Signed)
Reports of shortness of breath and cough over 1 week. Has been taking prednisone and z-pack with no relief. Reports of pain in back when she takes a deep breath in.

## 2017-01-16 NOTE — ED Notes (Addendum)
Patient now reporting chest pain and upper back pain. Patient appears diaphoretic.

## 2017-01-17 ENCOUNTER — Emergency Department (HOSPITAL_COMMUNITY): Payer: 59

## 2017-01-17 LAB — CBC WITH DIFFERENTIAL/PLATELET
BASOS ABS: 0 10*3/uL (ref 0.0–0.1)
BASOS PCT: 0 %
Eosinophils Absolute: 0 10*3/uL (ref 0.0–0.7)
Eosinophils Relative: 0 %
HEMATOCRIT: 38.7 % (ref 36.0–46.0)
HEMOGLOBIN: 12.6 g/dL (ref 12.0–15.0)
LYMPHS PCT: 13 %
Lymphs Abs: 1.9 10*3/uL (ref 0.7–4.0)
MCH: 27.6 pg (ref 26.0–34.0)
MCHC: 32.6 g/dL (ref 30.0–36.0)
MCV: 84.7 fL (ref 78.0–100.0)
MONO ABS: 0.4 10*3/uL (ref 0.1–1.0)
Monocytes Relative: 3 %
NEUTROS ABS: 12.6 10*3/uL — AB (ref 1.7–7.7)
NEUTROS PCT: 84 %
Platelets: 340 10*3/uL (ref 150–400)
RBC: 4.57 MIL/uL (ref 3.87–5.11)
RDW: 15.2 % (ref 11.5–15.5)
WBC: 14.9 10*3/uL — ABNORMAL HIGH (ref 4.0–10.5)

## 2017-01-17 LAB — COMPREHENSIVE METABOLIC PANEL
ALBUMIN: 3.5 g/dL (ref 3.5–5.0)
ALK PHOS: 77 U/L (ref 38–126)
ALT: 23 U/L (ref 14–54)
AST: 20 U/L (ref 15–41)
Anion gap: 11 (ref 5–15)
BILIRUBIN TOTAL: 0.3 mg/dL (ref 0.3–1.2)
BUN: 16 mg/dL (ref 6–20)
CALCIUM: 9.1 mg/dL (ref 8.9–10.3)
CO2: 27 mmol/L (ref 22–32)
CREATININE: 1.26 mg/dL — AB (ref 0.44–1.00)
Chloride: 100 mmol/L — ABNORMAL LOW (ref 101–111)
GFR calc Af Amer: >60 mL/min (ref >60)
GFR, EST NON AFRICAN AMERICAN: 54 mL/min — AB (ref >60)
GLUCOSE: 135 mg/dL — AB (ref 65–99)
POTASSIUM: 4.1 mmol/L (ref 3.5–5.1)
Sodium: 138 mmol/L (ref 135–145)
TOTAL PROTEIN: 7.9 g/dL (ref 6.5–8.1)

## 2017-01-17 LAB — PREGNANCY, URINE: PREG TEST UR: NEGATIVE

## 2017-01-17 MED ORDER — IOPAMIDOL (ISOVUE-370) INJECTION 76%
100.0000 mL | Freq: Once | INTRAVENOUS | Status: AC | PRN
  Administered 2017-01-17: 100 mL via INTRAVENOUS

## 2017-01-17 NOTE — ED Provider Notes (Signed)
[facility] DEPT Provider Note   CSN: [id number] Arrival date & time: 01/16/17  2227     History   Chief Complaint Chief Complaint  Patient presents with  . Shortness of Breath  . Chest Pain    HPI Gina Walker is a 36 y.o. female.  The history is provided by the patient. No language interpreter was used.  Shortness of Breath  This is a new problem. The problem occurs continuously.The current episode started more than 1 week ago. Associated symptoms include chest pain. Risk factors include oral contraceptive use. She has tried oral steroids for the symptoms. The treatment provided no relief. Associated medical issues include asthma.  Chest Pain   Associated symptoms include shortness of breath.  Pt reports she has been on prednisone and zithromax for bronchitis.  Pt reports she saw her primary who was concerned that pt needed a ct scan.  Pt reports increasing shortness of breath tonight.  Pt has noticed swelling in both legs.  Pt is on birth control.  Past Medical History:  Diagnosis Date  . Allergic rhinitis   . Asthma   . Diabetes mellitus without complication ([place])   . Fibroids   . HTN (hypertension)   . Obesity 04/17/2013    Patient Active Problem List   Diagnosis Date Noted  . Morbid (severe) obesity due to excess calories ([place]) 12/17/2015  . Mild persistent asthma 12/16/2015  . Mild persistent asthma with UACS 12/16/2015  . Fibroids 04/17/2013  . Obesity 04/17/2013  . Leiomyoma of uterus, unspecified 01/11/2013  . Dysmenorrhea 01/11/2013  . Excessive or frequent menstruation 01/11/2013  . HYPERLIPIDEMIA 02/08/2010  . HYPERTENSION 02/08/2010  . ALLERGIC RHINITIS 02/08/2010  . ASTHMA 02/08/2010  . SLEEP AROUSAL DISORDER 02/03/2010  . OBSTRUCTIVE SLEEP APNEA 02/03/2010    Past Surgical History:  Procedure Laterality Date  . GUM SURGERY    . TONSILLECTOMY    . WISDOM TOOTH EXTRACTION      OB History    No data available       Home  Medications    Prior to Admission medications   Medication Sig Start Date End Date Taking? Authorizing Provider  albuterol (PROAIR HFA) 108 (90 BASE) MCG/ACT inhaler Inhale 2 puffs into the lungs 4 (four) times daily as needed. 10/30/13   Tanda Rockers, MD  albuterol (PROVENTIL) (2.5 MG/3ML) 0.083% nebulizer solution Take 6 mLs (5 mg total) by nebulization every 6 (six) hours as needed for wheezing or shortness of breath. 08/05/13   Antonietta Breach, PA-C  budesonide-formoterol (SYMBICORT) 80-4.5 MCG/ACT inhaler Take 2 puffs first thing in am and then another 2 puffs about 12 hours later. 01/14/16   Tanda Rockers, MD  famotidine (PEPCID) 20 MG tablet One at bedtime Patient not taking: Reported on 01/14/2016 12/16/15   Tanda Rockers, MD  hydrochlorothiazide (HYDRODIURIL) 25 MG tablet Take 1 tablet by mouth daily. 11/22/15   [provider]  ibuprofen (ADVIL,MOTRIN) 200 MG tablet Take 200 mg by mouth every 6 (six) hours as needed for moderate pain.    [provider]  irbesartan (AVAPRO) 150 MG tablet Take 1 tablet by mouth daily. 11/22/15   [provider]  metFORMIN (GLUCOPHAGE) 500 MG tablet Take 1 tablet by mouth daily. 10/09/15   [provider]  montelukast (SINGULAIR) 10 MG tablet One at bedtime every night 10/30/13   Tanda Rockers, MD  pantoprazole (PROTONIX) 40 MG tablet Take 1 tablet (40 mg total) by mouth daily. Take 30-60 min  before first meal of the day 12/16/15   Tanda Rockers, MD  tetrahydrozoline-zinc (VISINE-AC) 0.05-0.25 % ophthalmic solution 2 drops 3 (three) times daily as needed.    [provider]  verapamil (CALAN) 120 MG tablet Take 120 mg by mouth daily.  12/07/12   [provider]    Family History Family History  Problem Relation Age of Onset  . Sleep apnea Father   . Colon cancer Maternal Grandmother   . Heart disease Maternal Grandmother   . Cancer Mother        breast  . Cancer Maternal Aunt        lung  . Clotting  disorder Maternal Aunt   . Heart disease Paternal Grandfather     Social History Social History  Substance Use Topics  . Smoking status: Never Smoker  . Smokeless tobacco: Never Used  . Alcohol use Yes     Comment: glass of wine 2x month     Allergies   Cefuroxime axetil; Shellfish allergy; and Omnicef [cefdinir]   Review of Systems Review of Systems  Respiratory: Positive for shortness of breath.   Cardiovascular: Positive for chest pain.  All other systems reviewed and are negative.    Physical Exam Updated Vital Signs BP 138/64 (BP Location: Right Arm)   Pulse 95   Temp 97.7 F (36.5 C) (Oral)   Resp 18   Ht 5\' 3"  (1.6 m)   Wt 124.7 kg (275 lb)   LMP 12/17/2016   SpO2 94%   BMI 48.71 kg/m   Physical Exam  Constitutional: She appears well-developed and well-nourished. No distress.  HENT:  Head: Normocephalic and atraumatic.  Right Ear: External ear normal.  Left Ear: External ear normal.  Nose: Nose normal.  Mouth/Throat: Oropharynx is clear and moist.  Eyes: Conjunctivae are normal.  Neck: Neck supple.  Cardiovascular: Normal rate and regular rhythm.   No murmur heard. Pulmonary/Chest: Effort normal and breath sounds normal. No respiratory distress.  Abdominal: Soft. There is no tenderness.  Musculoskeletal: She exhibits no edema.  Neurological: She is alert.  Skin: Skin is warm and dry.  Psychiatric: She has a normal mood and affect.  Nursing note and vitals reviewed.    ED Treatments / Results  Labs (all labs ordered are listed, but only abnormal results are displayed) Labs Reviewed  CBC WITH DIFFERENTIAL/PLATELET  COMPREHENSIVE METABOLIC PANEL  PREGNANCY, URINE    EKG  EKG Interpretation  Date/Time:  Monday January 16 2017 22:41:18 EDT Ventricular Rate:  87 PR Interval:  128 QRS Duration: 80 QT Interval:  362 QTC Calculation: 435 R Axis:   5 Text Interpretation:  Normal sinus rhythm nonspecific T waves/prolonged QT no longer present  compared to 2017 Confirmed by Sherwood Gambler [phone]) on 01/16/2017 11:07:53 PM       Radiology Dg Chest 2 View  Result Date: 01/16/2017 CLINICAL DATA:  Worsening shortness of Breath EXAM: CHEST  2 VIEW COMPARISON:  11/23/2015 FINDINGS: Cardiac shadows within normal limits. The overall inspiratory effort is poor although no focal infiltrate is seen. No bony abnormality is noted. IMPRESSION: No active cardiopulmonary disease. Electronically Signed   By: Inez Catalina M.D.   On: 01/16/2017 22:57    Procedures Procedures (including critical care time)  Medications Ordered in ED Medications  albuterol (PROVENTIL) (2.5 MG/3ML) 0.083% nebulizer solution 5 mg (5 mg Nebulization Given 01/16/17 2327)     Initial Impression / Assessment and Plan / ED Course  I have reviewed the triage vital  signs and the nursing notes.  Pertinent labs & imaging results that were available during my care of the patient were reviewed by me and considered in my medical decision making (see chart for details).       Final Clinical Impressions(s) / ED Diagnoses   Final diagnoses:  None    New Prescriptions New Prescriptions   No medications on file     Sidney Ace 01/17/17 Towner, [place], MD 01/17/17 [phone]

## 2019-10-14 ENCOUNTER — Emergency Department (HOSPITAL_COMMUNITY): Payer: 59

## 2019-10-14 ENCOUNTER — Observation Stay (HOSPITAL_COMMUNITY)
Admission: EM | Admit: 2019-10-14 | Discharge: 2019-10-15 | Disposition: A | Discharge status: Home / Self Care | Payer: 59 | Attending: Family Medicine | Admitting: Family Medicine

## 2019-10-14 ENCOUNTER — Ambulatory Visit
Admission: EM | Admit: 2019-10-14 | Discharge: 2019-10-14 | Disposition: A | Discharge status: Home / Self Care | Payer: 59 | Source: Home / Self Care

## 2019-10-14 ENCOUNTER — Other Ambulatory Visit: Payer: Self-pay

## 2019-10-14 ENCOUNTER — Encounter (HOSPITAL_COMMUNITY): Payer: Self-pay | Admitting: *Deleted

## 2019-10-14 DIAGNOSIS — I10 Essential (primary) hypertension: Secondary | ICD-10-CM | POA: Diagnosis not present

## 2019-10-14 DIAGNOSIS — E876 Hypokalemia: Secondary | ICD-10-CM | POA: Diagnosis present

## 2019-10-14 DIAGNOSIS — R0603 Acute respiratory distress: Principal | ICD-10-CM

## 2019-10-14 DIAGNOSIS — G4733 Obstructive sleep apnea (adult) (pediatric): Secondary | ICD-10-CM | POA: Diagnosis present

## 2019-10-14 DIAGNOSIS — Z7984 Long term (current) use of oral hypoglycemic drugs: Secondary | ICD-10-CM | POA: Insufficient documentation

## 2019-10-14 DIAGNOSIS — I160 Hypertensive urgency: Secondary | ICD-10-CM

## 2019-10-14 DIAGNOSIS — J4551 Severe persistent asthma with (acute) exacerbation: Secondary | ICD-10-CM | POA: Diagnosis not present

## 2019-10-14 DIAGNOSIS — E119 Type 2 diabetes mellitus without complications: Secondary | ICD-10-CM | POA: Diagnosis not present

## 2019-10-14 DIAGNOSIS — Z20822 Contact with and (suspected) exposure to covid-19: Secondary | ICD-10-CM | POA: Insufficient documentation

## 2019-10-14 DIAGNOSIS — E785 Hyperlipidemia, unspecified: Secondary | ICD-10-CM | POA: Diagnosis present

## 2019-10-14 DIAGNOSIS — J45901 Unspecified asthma with (acute) exacerbation: Secondary | ICD-10-CM | POA: Diagnosis present

## 2019-10-14 DIAGNOSIS — E1169 Type 2 diabetes mellitus with other specified complication: Secondary | ICD-10-CM

## 2019-10-14 LAB — COMPREHENSIVE METABOLIC PANEL WITH GFR
ALT: 18 U/L (ref 0–44)
AST: 27 U/L (ref 15–41)
Albumin: 4 g/dL (ref 3.5–5.0)
Alkaline Phosphatase: 68 U/L (ref 38–126)
Anion gap: 12 (ref 5–15)
BUN: 7 mg/dL (ref 6–20)
CO2: 25 mmol/L (ref 22–32)
Calcium: 9.2 mg/dL (ref 8.9–10.3)
Chloride: 99 mmol/L (ref 98–111)
Creatinine, Ser: 0.98 mg/dL (ref 0.44–1.00)
GFR calc Af Amer: >60 mL/min
GFR calc non Af Amer: >60 mL/min
Glucose, Bld: 97 mg/dL (ref 70–99)
Potassium: 3.4 mmol/L — ABNORMAL LOW (ref 3.5–5.1)
Sodium: 136 mmol/L (ref 135–145)
Total Bilirubin: 0.5 mg/dL (ref 0.3–1.2)
Total Protein: 8.2 g/dL — ABNORMAL HIGH (ref 6.5–8.1)

## 2019-10-14 LAB — CBC WITH DIFFERENTIAL/PLATELET
Abs Immature Granulocytes: 0.08 K/uL — ABNORMAL HIGH (ref 0.00–0.07)
Basophils Absolute: 0 K/uL (ref 0.0–0.1)
Basophils Relative: 0 %
Eosinophils Absolute: 1 K/uL — ABNORMAL HIGH (ref 0.0–0.5)
Eosinophils Relative: 9 %
HCT: 41.5 % (ref 36.0–46.0)
Hemoglobin: 13.2 g/dL (ref 12.0–15.0)
Immature Granulocytes: 1 %
Lymphocytes Relative: 24 %
Lymphs Abs: 2.6 K/uL (ref 0.7–4.0)
MCH: 28.6 pg (ref 26.0–34.0)
MCHC: 31.8 g/dL (ref 30.0–36.0)
MCV: 90 fL (ref 80.0–100.0)
Monocytes Absolute: 0.7 K/uL (ref 0.1–1.0)
Monocytes Relative: 6 %
Neutro Abs: 6.7 K/uL (ref 1.7–7.7)
Neutrophils Relative %: 60 %
Platelets: 323 K/uL (ref 150–400)
RBC: 4.61 MIL/uL (ref 3.87–5.11)
RDW: 14.3 % (ref 11.5–15.5)
WBC: 11.1 K/uL — ABNORMAL HIGH (ref 4.0–10.5)
nRBC: 0 % (ref 0.0–0.2)

## 2019-10-14 LAB — RESPIRATORY PANEL BY RT PCR (FLU A&B, COVID)
Influenza A by PCR: NEGATIVE
Influenza B by PCR: NEGATIVE
SARS Coronavirus 2 by RT PCR: NEGATIVE

## 2019-10-14 LAB — POC SARS CORONAVIRUS 2 AG -  ED: SARS Coronavirus 2 Ag: NEGATIVE

## 2019-10-14 MED ORDER — ENOXAPARIN SODIUM 60 MG/0.6ML ~~LOC~~ SOLN
60.0000 mg | Freq: Every day | SUBCUTANEOUS | Status: DC
  Administered 2019-10-15: 03:00:00 60 mg via SUBCUTANEOUS
  Filled 2019-10-14: qty 0.6

## 2019-10-14 MED ORDER — HYDROMORPHONE HCL 1 MG/ML IJ SOLN
1.0000 mg | Freq: Once | INTRAMUSCULAR | Status: AC
  Administered 2019-10-15: 1 mg via INTRAVENOUS
  Filled 2019-10-14: qty 1

## 2019-10-14 MED ORDER — POTASSIUM CHLORIDE CRYS ER 20 MEQ PO TBCR
40.0000 meq | EXTENDED_RELEASE_TABLET | Freq: Once | ORAL | Status: AC
  Administered 2019-10-14: 40 meq via ORAL
  Filled 2019-10-14: qty 2

## 2019-10-14 MED ORDER — ACETAMINOPHEN 650 MG RE SUPP: 650.0000 mg | Freq: Four times a day (QID) | RECTAL | Status: DC | PRN

## 2019-10-14 MED ORDER — ACETAMINOPHEN 325 MG PO TABS
650.0000 mg | ORAL_TABLET | Freq: Four times a day (QID) | ORAL | Status: DC | PRN
  Administered 2019-10-15: 11:00:00 650 mg via ORAL
  Filled 2019-10-14: qty 2

## 2019-10-14 MED ORDER — DILTIAZEM HCL 25 MG/5ML IV SOLN
20.0000 mg | Freq: Once | INTRAVENOUS | Status: AC
  Administered 2019-10-14: 20 mg via INTRAVENOUS
  Filled 2019-10-14: qty 5

## 2019-10-14 MED ORDER — IPRATROPIUM-ALBUTEROL 0.5-2.5 (3) MG/3ML IN SOLN
3.0000 mL | Freq: Once | RESPIRATORY_TRACT | Status: AC
  Administered 2019-10-14: 22:00:00 3 mL via RESPIRATORY_TRACT
  Filled 2019-10-14: qty 3

## 2019-10-14 MED ORDER — IPRATROPIUM-ALBUTEROL 0.5-2.5 (3) MG/3ML IN SOLN
3.0000 mL | Freq: Once | RESPIRATORY_TRACT | Status: AC
Start: 2019-10-14 — End: 2019-10-14
  Administered 2019-10-14: 22:00:00 3 mL via RESPIRATORY_TRACT
  Filled 2019-10-14: qty 3

## 2019-10-14 MED ORDER — HYDRALAZINE HCL 20 MG/ML IJ SOLN
20.0000 mg | Freq: Once | INTRAMUSCULAR | Status: AC
  Administered 2019-10-14: 22:00:00 20 mg via INTRAVENOUS
  Filled 2019-10-14: qty 1

## 2019-10-14 MED ORDER — ALBUTEROL SULFATE HFA 108 (90 BASE) MCG/ACT IN AERS
2.0000 | INHALATION_SPRAY | Freq: Once | RESPIRATORY_TRACT | Status: DC
  Filled 2019-10-14: qty 6.7

## 2019-10-14 MED ORDER — MAGNESIUM SULFATE 2 GM/50ML IV SOLN
2.0000 g | Freq: Once | INTRAVENOUS | Status: AC
  Administered 2019-10-14: 2 g via INTRAVENOUS
  Filled 2019-10-14: qty 50

## 2019-10-14 MED ORDER — ONDANSETRON HCL 4 MG/2ML IJ SOLN
4.0000 mg | Freq: Four times a day (QID) | INTRAMUSCULAR | Status: DC | PRN
  Administered 2019-10-15: 4 mg via INTRAVENOUS
  Filled 2019-10-14: qty 2

## 2019-10-14 MED ORDER — METHYLPREDNISOLONE SODIUM SUCC 125 MG IJ SOLR
125.0000 mg | Freq: Once | INTRAMUSCULAR | Status: AC
  Administered 2019-10-14: 21:00:00 125 mg via INTRAVENOUS
  Filled 2019-10-14: qty 2

## 2019-10-14 MED ORDER — METHYLPREDNISOLONE SODIUM SUCC 40 MG IJ SOLR
40.0000 mg | Freq: Four times a day (QID) | INTRAMUSCULAR | Status: DC
  Administered 2019-10-15 (×3): 40 mg via INTRAVENOUS
  Filled 2019-10-14 (×3): qty 1

## 2019-10-14 MED ORDER — ONDANSETRON HCL 4 MG PO TABS: 4.0000 mg | ORAL_TABLET | Freq: Four times a day (QID) | ORAL | Status: DC | PRN

## 2019-10-14 MED ORDER — INSULIN ASPART 100 UNIT/ML ~~LOC~~ SOLN
0.0000 [IU] | Freq: Three times a day (TID) | SUBCUTANEOUS | Status: DC
  Administered 2019-10-15 (×3): 4 [IU] via SUBCUTANEOUS

## 2019-10-14 MED ORDER — ALBUTEROL SULFATE HFA 108 (90 BASE) MCG/ACT IN AERS
2.0000 | INHALATION_SPRAY | Freq: Once | RESPIRATORY_TRACT | Status: AC
  Administered 2019-10-14: 21:00:00 2 via RESPIRATORY_TRACT

## 2019-10-14 MED ORDER — AEROCHAMBER Z-STAT PLUS/MEDIUM MISC
1.0000 | Freq: Once | Status: AC
  Administered 2019-10-14: 21:00:00 1

## 2019-10-14 MED ORDER — KETOROLAC TROMETHAMINE 30 MG/ML IJ SOLN
30.0000 mg | Freq: Once | INTRAMUSCULAR | Status: AC
  Administered 2019-10-15: 30 mg via INTRAVENOUS
  Filled 2019-10-14: qty 1

## 2019-10-14 NOTE — ED Provider Notes (Signed)
[facility] EMERGENCY DEPARTMENT Provider Note   CSN: [id number] Arrival date & time: 10/14/19  1928     History Chief Complaint  Patient presents with  . Shortness of Breath    Gina Walker is a 39 y.o. female.  HPI   39 year old female, with a history of asthma, history of diabetes, history of allergies presents to the hospital with a complaint of shortness of breath.  This started approximately 3 weeks ago, she started with allergies with runny nose, itchy watery eyes and sneezing.  This progressed into some respiratory symptoms and over the last 2 weeks she is troubled with wheezing and shortness of breath despite using her normal every day medications for asthma which include Symbicort, albuterol inhalers and Singulair.  She presents from the urgent care with a complaint of severe shortness of breath.  This is worsened, she is having severe symptoms that seem to only mildly improved with her treatments.  She denies any objective fevers but has had some chills and some loose stools.  Past Medical History:  Diagnosis Date  . Allergic rhinitis   . Asthma   . Diabetes mellitus without complication ([place])   . Fibroids   . HTN (hypertension)   . Obesity 04/17/2013    Patient Active Problem List   Diagnosis Date Noted  . Asthma exacerbation 10/14/2019  . Morbid (severe) obesity due to excess calories ([place]) 12/17/2015  . Mild persistent asthma 12/16/2015  . Mild persistent asthma with UACS 12/16/2015  . Fibroids 04/17/2013  . Obesity 04/17/2013  . Leiomyoma of uterus, unspecified 01/11/2013  . Dysmenorrhea 01/11/2013  . Excessive or frequent menstruation 01/11/2013  . HYPERLIPIDEMIA 02/08/2010  . HYPERTENSION 02/08/2010  . ALLERGIC RHINITIS 02/08/2010  . ASTHMA 02/08/2010  . SLEEP AROUSAL DISORDER 02/03/2010  . OBSTRUCTIVE SLEEP APNEA 02/03/2010    Past Surgical History:  Procedure Laterality Date  . GUM SURGERY    . TONSILLECTOMY    . WISDOM TOOTH EXTRACTION         OB History   No obstetric history on file.     Family History  Problem Relation Age of Onset  . Sleep apnea Father   . Colon cancer Maternal Grandmother   . Heart disease Maternal Grandmother   . Cancer Mother        breast  . Cancer Maternal Aunt        lung  . Clotting disorder Maternal Aunt   . Heart disease Paternal Grandfather     Social History   Tobacco Use  . Smoking status: Never Smoker  . Smokeless tobacco: Never Used  Substance Use Topics  . Alcohol use: Yes    Comment: glass of wine 2x month  . Drug use: No    Home Medications Prior to Admission medications   Medication Sig Start Date End Date Taking? Authorizing Provider  albuterol (PROAIR HFA) 108 (90 BASE) MCG/ACT inhaler Inhale 2 puffs into the lungs 4 (four) times daily as needed. Patient taking differently: Inhale 2 puffs into the lungs 4 (four) times daily as needed for wheezing or shortness of breath.  10/30/13   Tanda Rockers, MD  albuterol (PROVENTIL) (2.5 MG/3ML) 0.083% nebulizer solution Take 6 mLs (5 mg total) by nebulization every 6 (six) hours as needed for wheezing or shortness of breath. 08/05/13   Antonietta Breach, PA-C  budesonide-formoterol (SYMBICORT) 80-4.5 MCG/ACT inhaler Take 2 puffs first thing in am and then another 2 puffs about 12 hours later. 01/14/16   Christinia Gully  B, MD  hydrochlorothiazide (HYDRODIURIL) 25 MG tablet Take 1 tablet by mouth daily. 11/22/15   [provider]  ibuprofen (ADVIL,MOTRIN) 200 MG tablet Take 200 mg by mouth every 6 (six) hours as needed for moderate pain.    [provider]  irbesartan (AVAPRO) 150 MG tablet Take 1 tablet by mouth daily. 11/22/15   [provider]  JUNEL FE 1/20 1-20 MG-MCG tablet Take 1 tablet by mouth daily. 10/13/19   [provider]  metFORMIN (GLUCOPHAGE) 500 MG tablet Take 1 tablet by mouth daily. 10/09/15   [provider]  montelukast (SINGULAIR) 10 MG tablet One at bedtime every  night Patient taking differently: Take 10 mg by mouth at bedtime.  10/30/13   Tanda Rockers, MD  pantoprazole (PROTONIX) 40 MG tablet Take 1 tablet (40 mg total) by mouth daily. Take 30-60 min before first meal of the day 12/16/15   Tanda Rockers, MD  SPIRIVA RESPIMAT 1.25 MCG/ACT AERS Inhale 1 puff into the lungs daily.  09/18/19   [provider]  tetrahydrozoline-zinc (VISINE-AC) 0.05-0.25 % ophthalmic solution Place 2 drops into both eyes 3 (three) times daily as needed (for dry eye relief).     [provider]  verapamil (CALAN-SR) 180 MG CR tablet Take 180 mg by mouth daily. 07/30/19   [provider]    Allergies    Cefuroxime axetil, Shellfish allergy, and Omnicef [cefdinir]  Review of Systems   Review of Systems  All other systems reviewed and are negative.   Physical Exam Updated Vital Signs BP (!) 206/114   Pulse (!) 124   Temp 98.8 F (37.1 C) (Oral)   Resp 19   Ht 1.6 m (5\' 3" )   Wt 132 kg   LMP 10/09/2019   SpO2 100%   BMI 51.55 kg/m   Physical Exam Vitals and nursing note reviewed.  Constitutional:      General: She is in acute distress.     Appearance: She is well-developed.  HENT:     Head: Normocephalic and atraumatic.     Mouth/Throat:     Pharynx: No oropharyngeal exudate.  Eyes:     General: No scleral icterus.       Right eye: No discharge.        Left eye: No discharge.     Conjunctiva/sclera: Conjunctivae normal.     Pupils: Pupils are equal, round, and reactive to light.  Neck:     Thyroid: No thyromegaly.     Vascular: No JVD.  Cardiovascular:     Rate and Rhythm: Regular rhythm. Tachycardia present.     Heart sounds: Normal heart sounds. No murmur. No friction rub. No gallop.      Comments: Heart rate of 100 Pulmonary:     Effort: Respiratory distress present.     Breath sounds: Wheezing present. No rales.     Comments: The patient has significant increased work of breathing with speaking in very short and  sentences, prolonged expiratory phase, shallow breathing, diffuse expiratory wheezing in all lung fields. Abdominal:     General: Bowel sounds are normal. There is no distension.     Palpations: Abdomen is soft. There is no mass.     Tenderness: There is no abdominal tenderness.  Musculoskeletal:        General: No tenderness. Normal range of motion.     Cervical back: Normal range of motion and neck supple.  Lymphadenopathy:     Cervical: No cervical adenopathy.  Skin:    General: Skin is warm and dry.     Findings: No erythema or rash.  Neurological:     Mental Status: She is alert.     Coordination: Coordination normal.  Psychiatric:        Behavior: Behavior normal.     ED Results / Procedures / Treatments   Labs (all labs ordered are listed, but only abnormal results are displayed) Labs Reviewed  CBC WITH DIFFERENTIAL/PLATELET - Abnormal; Notable for the following components:      Result Value   WBC 11.1 (*)    Eosinophils Absolute 1.0 (*)    Abs Immature Granulocytes 0.08 (*)    All other components within normal limits  COMPREHENSIVE METABOLIC PANEL - Abnormal; Notable for the following components:   Potassium 3.4 (*)    Total Protein 8.2 (*)    All other components within normal limits  RESPIRATORY PANEL BY RT PCR (FLU A&B, COVID)  MAGNESIUM  PHOSPHORUS  POC SARS CORONAVIRUS 2 AG -  ED    EKG EKG Interpretation  Date/Time:  Monday October 14 2019 19:41:51 EDT Ventricular Rate:  106 PR Interval:    QRS Duration: 90 QT Interval:  342 QTC Calculation: 455 R Axis:   35 Text Interpretation: Sinus tachycardia Probable left atrial enlargement RSR' in V1 or V2, probably normal variant Nonspecific T abnormalities, lateral leads Baseline wander in lead(s) V6 Since last tracing Nonspecific T wave abnormality now preasent Confirmed by Noemi Chapel [phone]) on 10/14/2019 8:09:18 PM   Radiology DG Chest Port 1 View  Result Date: 10/14/2019 CLINICAL DATA:  Cough and  short of breath EXAM: PORTABLE CHEST 1 VIEW COMPARISON:  01/16/2017 FINDINGS: Minimal streaky atelectasis or small infiltrates at the bases. No pleural effusion. Normal heart size. No pneumothorax IMPRESSION: Minimal streaky basilar opacities, favor atelectasis over small pneumonias. Electronically Signed   By: Donavan Foil M.D.   On: 10/14/2019 21:08    Procedures Procedures (including critical care time)  Medications Ordered in ED Medications  diltiazem (CARDIZEM) injection 20 mg (has no administration in time range)  magnesium sulfate IVPB 2 g 50 mL (has no administration in time range)  potassium chloride SA (KLOR-CON) CR tablet 40 mEq (has no administration in time range)  methylPREDNISolone sodium succinate (SOLU-MEDROL) 125 mg/2 mL injection 125 mg (125 mg Intravenous Given 10/14/19 2101)  albuterol (VENTOLIN HFA) 108 (90 Base) MCG/ACT inhaler 2 puff (2 puffs Inhalation Given 10/14/19 2101)  aerochamber Z-Stat Plus/medium 1 each (1 each Other Given 10/14/19 2111)  ipratropium-albuterol (DUONEB) 0.5-2.5 (3) MG/3ML nebulizer solution 3 mL (3 mLs Nebulization Given 10/14/19 2219)  ipratropium-albuterol (DUONEB) 0.5-2.5 (3) MG/3ML nebulizer solution 3 mL (3 mLs Nebulization Given 10/14/19 2219)  hydrALAZINE (APRESOLINE) injection 20 mg (20 mg Intravenous Given 10/14/19 2219)    ED Course  I have reviewed the triage vital signs and the nursing notes.  Pertinent labs & imaging results that were available during my care of the patient were reviewed by me and considered in my medical decision making (see chart for details).    MDM Rules/Calculators/A&P                       This patient presents to the ED for concern of shortness of breath and wheezing, this involves an extensive number of treatment options, and is a complaint that carries with it a high risk of complications and morbidity.  The differential diagnosis includes acute respiratory distress secondary to infection, pneumonia,  reactive airway disease, asthma, seasonal allergies, much less likely to be pulmonary embolism or congestive heart failure or pneumothorax   Lab Tests:   I Ordered, reviewed, and interpreted labs, which included CBC, metabolic panel  Medicines ordered:   I ordered medication albuterol and Solu-Medrol for severe wheezing and shortness of breath.  BP medicines  Imaging Studies ordered:   I ordered imaging studies which included portable chest x-ray and  I independently visualized and interpreted imaging which showed no acute pneumonia  Additional history obtained:   Additional history obtained from medical record and the patient  Previous records obtained and reviewed   Consultations Obtained:   I consulted hospitalist and discussed lab and imaging findings  Reevaluation:  After the interventions stated above, I reevaluated the patient and found that the patient had improved slightly but was still dyspneic speaking in shortened sentences and severely hypertensive  Critical Interventions:  . Blood pressure control, continuous nebulizer therapy for severe respiratory distress, consultation with hospitalist  Final Clinical Impression(s) / ED Diagnoses Final diagnoses:  Acute respiratory distress  Severe hypertension  Severe persistent asthma with exacerbation    Rx / DC Orders ED Discharge Orders    None       Noemi Chapel, MD 10/14/19 2328

## 2019-10-14 NOTE — ED Triage Notes (Signed)
EMS called and will be transported to ED ,

## 2019-10-14 NOTE — ED Notes (Signed)
Pt has dyspnea on exertion while ambulating to the restroom.

## 2019-10-14 NOTE — Progress Notes (Signed)
Lovenox per Pharmacy for DVT Prophylaxis    Pharmacy has been consulted from dosing enoxaparin (lovenox) in this patient for DVT prophylaxis.  The pharmacist has reviewed pertinent labs (Hgb 13.2___; PLT__323_), patient weight (_132__kg) and renal function (CrCl_>90__mL/min) and decided that enoxaparin 60__mg SQ Q24Hrs is appropriate for this patient.  The pharmacy department will sign off at this time.  Please reconsult pharmacy if status changes or for further issues.  Thank you  Cyndia Diver PharmD, BCPS  10/14/2019, 11:38 PM

## 2019-10-14 NOTE — ED Triage Notes (Signed)
Pt from urgent care via [facility] for shortness of breath. History of "severe asthma" has been having worsening shortness of breath. Presents with O2 at 4L [place], sat 99%. Audible wheezing

## 2019-10-14 NOTE — ED Triage Notes (Signed)
Pt presents in respiratory distress , initial O2 is 88%, placed on nasal cannula and up to 95%

## 2019-10-14 NOTE — H&P (Signed)
History and Physical    SELIA MCELMURRY [id number] DOB: 1981-02-07 DOA: 10/14/2019  PCP: Patient, No Pcp Per   Patient coming from: Home.  I have personally briefly reviewed patient's old medical records in [place]  Chief Complaint: Shortness of breath and wheezing.  HPI: Gina Walker is a 39 y.o. female with medical history significant of allergic rhinitis, asthma, type 2 diabetes, uterine fibroids, hypertension, hyperlipidemia, class III obesity with a BMI of 51.55 kg/m, obstructive sleep apnea on CPAP who is coming to the emergency department due to progressively worse dyspnea associated with rhinorrhea, sneezing, nasal and ocular pruritus, wheezing for the past 3 weeks despite using her Symbicort, montelukast and albuterol as needed.  She has had occasionally productive cough of clear whitish sputum.  She denies fever, chills or night sweats, but complains of fatigue.  She has pleuritic chest pain of lower chest wall bilaterally.  Denies dizziness, palpitations, diaphoresis, lower extremity edema, abdominal pain, nausea, vomiting, diarrhea, constipation, melena or hematochezia.  No dysuria, frequency or hematuria.  No polyuria, polydipsia, polyphagia or blurred vision.  ED Course: Initial vital signs were temperature 98.8 F, pulse 107, respirations 22, blood pressure 176/100 mmHg and O2 sat 100% on nasal cannula oxygen.  The patient received supplemental oxygen, bronchodilators, 125 mg of Solu-Medrol IV and hydralazine 20 mg IVP in the ED.  I added Toradol 30 mg IVP, hydromorphone 1 mg IVP, magnesium and potassium supplementation, Cardizem 20 mg IVP and x1 metoprolol 5 mg.  CBC showed a white count 11.1, hemoglobin 13.2 g/dL and platelets 323.  Potassium was 3.4 mmol/L.  Total protein 8.2 g/dL.  The rest of the CMP values were unremarkable.  SARS coronavirus and influenza A/B PCR was negative.  BNP is 16.0 pg/mL.  Magnesium and phosphorus were normal.  I added a D-dimer which  was 0.76.  Her chest radiograph shows minimal streaky basilar opacities likely atelectasis over small area of pneumonia.  Review of Systems: As per HPI otherwise all other systems reviewed and are negative.  Past Medical History:  Diagnosis Date  . Allergic rhinitis   . Asthma   . Diabetes mellitus without complication ([place])   . Fibroids   . HTN (hypertension)   . Hyperlipidemia 02/08/2010   Qualifier: Diagnosis of  By: Annamaria Boots MD, [place] D   . Obesity 04/17/2013  . OBSTRUCTIVE SLEEP APNEA 02/03/2010   Qualifier: Diagnosis of  By: Annamaria Boots MD, Kasandra Knudsen     Past Surgical History:  Procedure Laterality Date  . GUM SURGERY    . TONSILLECTOMY    . WISDOM TOOTH EXTRACTION      Social History  reports that she has never smoked. She has never used smokeless tobacco. She reports current alcohol use. She reports that she does not use drugs.  Allergies  Allergen Reactions  . Cefuroxime Axetil Anaphylaxis and Itching    Hot feeling all over  . Shellfish Allergy Anaphylaxis  . Omnicef [Cefdinir]     Family History  Problem Relation Age of Onset  . Sleep apnea Father   . Colon cancer Maternal Grandmother   . Heart disease Maternal Grandmother   . Cancer Mother        breast  . Cancer Maternal Aunt        lung  . Clotting disorder Maternal Aunt   . Heart disease Paternal Grandfather    Prior to Admission medications   Medication Sig Start Date End Date Taking? Authorizing Provider  albuterol (PROAIR HFA) 108 (  90 BASE) MCG/ACT inhaler Inhale 2 puffs into the lungs 4 (four) times daily as needed. Patient taking differently: Inhale 2 puffs into the lungs 4 (four) times daily as needed for wheezing or shortness of breath.  10/30/13   Tanda Rockers, MD  albuterol (PROVENTIL) (2.5 MG/3ML) 0.083% nebulizer solution Take 6 mLs (5 mg total) by nebulization every 6 (six) hours as needed for wheezing or shortness of breath. 08/05/13   Antonietta Breach, PA-C  budesonide-formoterol (SYMBICORT) 80-4.5  MCG/ACT inhaler Take 2 puffs first thing in am and then another 2 puffs about 12 hours later. 01/14/16   Tanda Rockers, MD  hydrochlorothiazide (HYDRODIURIL) 25 MG tablet Take 1 tablet by mouth daily. 11/22/15   [provider]  ibuprofen (ADVIL,MOTRIN) 200 MG tablet Take 200 mg by mouth every 6 (six) hours as needed for moderate pain.    [provider]  irbesartan (AVAPRO) 150 MG tablet Take 1 tablet by mouth daily. 11/22/15   [provider]  JUNEL FE 1/20 1-20 MG-MCG tablet Take 1 tablet by mouth daily. 10/13/19   [provider]  metFORMIN (GLUCOPHAGE) 500 MG tablet Take 1 tablet by mouth daily. 10/09/15   [provider]  montelukast (SINGULAIR) 10 MG tablet One at bedtime every night Patient taking differently: Take 10 mg by mouth at bedtime.  10/30/13   Tanda Rockers, MD  pantoprazole (PROTONIX) 40 MG tablet Take 1 tablet (40 mg total) by mouth daily. Take 30-60 min before first meal of the day 12/16/15   Tanda Rockers, MD  SPIRIVA RESPIMAT 1.25 MCG/ACT AERS Inhale 1 puff into the lungs daily.  09/18/19   [provider]  tetrahydrozoline-zinc (VISINE-AC) 0.05-0.25 % ophthalmic solution Place 2 drops into both eyes 3 (three) times daily as needed (for dry eye relief).     [provider]  verapamil (CALAN-SR) 180 MG CR tablet Take 180 mg by mouth daily. 07/30/19   [provider]    Physical Exam: Vitals:   10/14/19 2219 10/14/19 2230 10/14/19 2245 10/14/19 2300  BP: (!) 204/109 (!) 195/96 (!) 202/103 (!) 206/114  Pulse: (!) 107 (!) 115 (!) 118 (!) 124  Resp: (!) 34 17 18 19   Temp:      TempSrc:      SpO2: 99% 100% 100% 100%  Weight:      Height:        Constitutional: NAD, calm, comfortable Eyes: PERRL, lids and conjunctivae normal ENMT: Mucous membranes are moist. Posterior pharynx clear of any exudate or lesions. Neck: normal, supple, no masses, no thyromegaly Respiratory: No accessory muscle use.  Decreased  breath sounds with bilateral rhonchi and wheezing, no crackles. Cardiovascular: Tachycardic at 112 bpm, no murmurs / rubs / gallops. No extremity edema. 2+ pedal pulses. No carotid bruits.  Abdomen: Nondistended.  Obese.  BS positive.  Soft, no tenderness, no masses palpated. No hepatosplenomegaly. Musculoskeletal: no clubbing / cyanosis.  Good ROM, no contractures. Normal muscle tone.  Skin: no rashes, lesions, ulcers on very limited dermatological examination Neurologic: CN 2-12 grossly intact. Sensation intact, DTR normal. Strength 5/5 in all 4.  Psychiatric: Normal judgment and insight. Alert and oriented x 3.  Mildly anxious mood.   Labs on Admission: I have personally reviewed following labs and imaging studies  CBC: Recent Labs  Lab 10/14/19 2040  WBC 11.1*  NEUTROABS 6.7  HGB 13.2  HCT 41.5  MCV 90.0  PLT [id number]    Basic Metabolic Panel: Recent Labs  Lab  10/14/19 2040  NA 136  K 3.4*  CL 99  CO2 25  GLUCOSE 97  BUN 7  CREATININE 0.98  CALCIUM 9.2    GFR: Estimated Creatinine Clearance: 101.4 mL/min (by C-G formula based on SCr of 0.98 mg/dL).  Liver Function Tests: Recent Labs  Lab 10/14/19 2040  AST 27  ALT 18  ALKPHOS 68  BILITOT 0.5  PROT 8.2*  ALBUMIN 4.0    Radiological Exams on Admission: DG Chest Port 1 View  Result Date: 10/14/2019 CLINICAL DATA:  Cough and short of breath EXAM: PORTABLE CHEST 1 VIEW COMPARISON:  01/16/2017 FINDINGS: Minimal streaky atelectasis or small infiltrates at the bases. No pleural effusion. Normal heart size. No pneumothorax IMPRESSION: Minimal streaky basilar opacities, favor atelectasis over small pneumonias. Electronically Signed   By: Donavan Foil M.D.   On: 10/14/2019 21:08    EKG: Independently reviewed.  Vent. rate 106 BPM PR interval * ms QRS duration 90 ms QT/QTc 342/455 ms P-R-T axes 61 35 118 Sinus tachycardia Probable left atrial enlargement RSR' in V1 or V2, probably normal variant Nonspecific T  abnormalities, lateral leads Baseline wander in lead(s) V6 Since last tracing Nonspecific T wave abnormality now preasent  Assessment/Plan Principal Problem:   Asthma exacerbation Observation/telemetry. Continue supplemental oxygen. Continue bronchodilators scheduled and as needed. Continue Solu-Medrol followed by prednisone taper. Continue montelukast.  Active Problems:   OBSTRUCTIVE SLEEP APNEA On nocturnal CPAP at home. Will use BiPAP while in the hospital. Weight loss advised.    Hypertensive urgency Continue verapamil. Resume Avapro. Monitor blood pressure. Adjust therapy as needed.    Hyperlipidemia Not on medical treatment. Lifestyle modifications. Should follow-up with PCP.    Type 2 diabetes mellitus (HCC) Carbohydrate modified diet. Check hemoglobin A1c. CBG monitoring with [facility] SS while on glucocorticoids.    Hypokalemia Replacement given. Magnesium was also supplemented. Follow-up potassium level.   DVT prophylaxis: Lovenox SQ. Code Status:   Full code. Family Communication: Disposition Plan:   Patient is from:  Home.  Anticipated DC to:  Home.  Anticipated DC date:  10/16/2019.  Anticipated DC barriers: Clinical improvement.  Consults called: Admission status:  Observation/telemetry.    Severity of Illness: Moderate severity.  Reubin Milan MD [facility]  How to contact the [facility] Attending or Consulting provider [place] or covering provider during after hours [place], for this patient?   1. Check the care team in [facility] and look for a) attending/consulting [facility] provider listed and b) the [facility] team listed 2. Log into [email or web address] and use [place]'s universal password to access. If you do not have the password, please contact the hospital operator. 3. Locate the [facility] provider you are looking for under [facility] and page to a number that you can be directly reached. 4. If you still have difficulty reaching the provider, please  page the [facility] (Director on Call) for the Hospitalists listed on amion for assistance.  10/14/2019, 11:40 PM   This document was prepared using Dragon voice recognition software and may contain some unintended transcription errors.

## 2019-10-15 ENCOUNTER — Encounter (HOSPITAL_COMMUNITY): Payer: Self-pay | Admitting: Internal Medicine

## 2019-10-15 ENCOUNTER — Observation Stay (HOSPITAL_COMMUNITY): Payer: 59

## 2019-10-15 DIAGNOSIS — I160 Hypertensive urgency: Secondary | ICD-10-CM | POA: Diagnosis not present

## 2019-10-15 DIAGNOSIS — J4551 Severe persistent asthma with (acute) exacerbation: Secondary | ICD-10-CM | POA: Diagnosis not present

## 2019-10-15 LAB — MAGNESIUM: Magnesium: 2.1 mg/dL (ref 1.7–2.4)

## 2019-10-15 LAB — COMPREHENSIVE METABOLIC PANEL
ALT: 19 U/L (ref 0–44)
AST: 26 U/L (ref 15–41)
Albumin: 3.6 g/dL (ref 3.5–5.0)
Alkaline Phosphatase: 65 U/L (ref 38–126)
Anion gap: 12 (ref 5–15)
BUN: 10 mg/dL (ref 6–20)
CO2: 23 mmol/L (ref 22–32)
Calcium: 9.5 mg/dL (ref 8.9–10.3)
Chloride: 100 mmol/L (ref 98–111)
Creatinine, Ser: 1.12 mg/dL — ABNORMAL HIGH (ref 0.44–1.00)
GFR calc Af Amer: >60 mL/min (ref >60)
GFR calc non Af Amer: >60 mL/min (ref >60)
Glucose, Bld: 260 mg/dL — ABNORMAL HIGH (ref 70–99)
Potassium: 5.1 mmol/L (ref 3.5–5.1)
Sodium: 135 mmol/L (ref 135–145)
Total Bilirubin: 0.4 mg/dL (ref 0.3–1.2)
Total Protein: 8.4 g/dL — ABNORMAL HIGH (ref 6.5–8.1)

## 2019-10-15 LAB — CBC
HCT: 43.3 % (ref 36.0–46.0)
Hemoglobin: 13.8 g/dL (ref 12.0–15.0)
MCH: 28.6 pg (ref 26.0–34.0)
MCHC: 31.9 g/dL (ref 30.0–36.0)
MCV: 89.8 fL (ref 80.0–100.0)
Platelets: 333 10*3/uL (ref 150–400)
RBC: 4.82 MIL/uL (ref 3.87–5.11)
RDW: 14.3 % (ref 11.5–15.5)
WBC: 11.7 10*3/uL — ABNORMAL HIGH (ref 4.0–10.5)
nRBC: 0 % (ref 0.0–0.2)

## 2019-10-15 LAB — PHOSPHORUS: Phosphorus: 4 mg/dL (ref 2.5–4.6)

## 2019-10-15 LAB — GLUCOSE, CAPILLARY
Glucose-Capillary: 181 mg/dL — ABNORMAL HIGH (ref 70–99)
Glucose-Capillary: 171 mg/dL — ABNORMAL HIGH (ref 70–99)
Glucose-Capillary: 153 mg/dL — ABNORMAL HIGH (ref 70–99)

## 2019-10-15 LAB — HEMOGLOBIN A1C
Hgb A1c MFr Bld: 6.2 % — ABNORMAL HIGH (ref 4.8–5.6)
Mean Plasma Glucose: 131.24 mg/dL

## 2019-10-15 LAB — HIV ANTIBODY (ROUTINE TESTING W REFLEX): HIV Screen 4th Generation wRfx: NONREACTIVE

## 2019-10-15 LAB — D-DIMER, QUANTITATIVE: D-Dimer, Quant: 0.76 ug/mL-FEU — ABNORMAL HIGH (ref 0.00–0.50)

## 2019-10-15 LAB — BRAIN NATRIURETIC PEPTIDE: B Natriuretic Peptide: 16 pg/mL (ref 0.0–100.0)

## 2019-10-15 MED ORDER — ALBUTEROL SULFATE HFA 108 (90 BASE) MCG/ACT IN AERS: 2.0000 | INHALATION_SPRAY | Freq: Four times a day (QID) | RESPIRATORY_TRACT | 5 refills | Status: AC | PRN

## 2019-10-15 MED ORDER — VERAPAMIL HCL ER 180 MG PO TBCR: 180.0000 mg | EXTENDED_RELEASE_TABLET | Freq: Every day | ORAL | 3 refills | Status: DC

## 2019-10-15 MED ORDER — PREDNISONE 20 MG PO TABS: 40.0000 mg | ORAL_TABLET | Freq: Every day | ORAL | 0 refills | Status: DC

## 2019-10-15 MED ORDER — VERAPAMIL HCL ER 180 MG PO TBCR
180.0000 mg | EXTENDED_RELEASE_TABLET | Freq: Every day | ORAL | Status: DC
  Administered 2019-10-15: 10:00:00 180 mg via ORAL
  Filled 2019-10-15: qty 1

## 2019-10-15 MED ORDER — PANTOPRAZOLE SODIUM 40 MG PO TBEC
40.0000 mg | DELAYED_RELEASE_TABLET | Freq: Every day | ORAL | Status: DC
  Administered 2019-10-15: 10:00:00 40 mg via ORAL
  Filled 2019-10-15: qty 1

## 2019-10-15 MED ORDER — IRBESARTAN 150 MG PO TABS
150.0000 mg | ORAL_TABLET | Freq: Every day | ORAL | Status: DC
  Administered 2019-10-15 (×2): 150 mg via ORAL
  Filled 2019-10-15 (×5): qty 1

## 2019-10-15 MED ORDER — BUDESONIDE-FORMOTEROL FUMARATE 160-4.5 MCG/ACT IN AERO: 2.0000 | INHALATION_SPRAY | Freq: Two times a day (BID) | RESPIRATORY_TRACT | 5 refills | Status: AC

## 2019-10-15 MED ORDER — MONTELUKAST SODIUM 10 MG PO TABS: 10.0000 mg | ORAL_TABLET | Freq: Every day | ORAL | Status: DC

## 2019-10-15 MED ORDER — PANTOPRAZOLE SODIUM 40 MG PO TBEC: 40.0000 mg | DELAYED_RELEASE_TABLET | Freq: Every day | ORAL | 0 refills | Status: DC

## 2019-10-15 MED ORDER — ALBUTEROL SULFATE (2.5 MG/3ML) 0.083% IN NEBU
2.5000 mg | INHALATION_SOLUTION | RESPIRATORY_TRACT | Status: DC | PRN
  Administered 2019-10-15: 03:00:00 2.5 mg via RESPIRATORY_TRACT
  Filled 2019-10-15: qty 3

## 2019-10-15 MED ORDER — JUNEL FE 1/20 1-20 MG-MCG PO TABS: 1.0000 | ORAL_TABLET | Freq: Every day | ORAL | 4 refills | Status: DC

## 2019-10-15 MED ORDER — IPRATROPIUM-ALBUTEROL 0.5-2.5 (3) MG/3ML IN SOLN
3.0000 mL | Freq: Four times a day (QID) | RESPIRATORY_TRACT | Status: DC
  Administered 2019-10-15 (×3): 3 mL via RESPIRATORY_TRACT
  Filled 2019-10-15 (×3): qty 3

## 2019-10-15 MED ORDER — IOHEXOL 350 MG/ML SOLN
100.0000 mL | Freq: Once | INTRAVENOUS | Status: AC | PRN
  Administered 2019-10-15: 13:00:00 100 mL via INTRAVENOUS

## 2019-10-15 MED ORDER — VERAPAMIL HCL ER 180 MG PO TBCR: 180.0000 mg | EXTENDED_RELEASE_TABLET | Freq: Every day | ORAL | 0 refills | Status: DC

## 2019-10-15 MED ORDER — MONTELUKAST SODIUM 10 MG PO TABS: 10.0000 mg | ORAL_TABLET | Freq: Every day | ORAL | 5 refills | Status: DC

## 2019-10-15 MED ORDER — ALBUTEROL SULFATE (2.5 MG/3ML) 0.083% IN NEBU: 5.0000 mg | INHALATION_SOLUTION | Freq: Four times a day (QID) | RESPIRATORY_TRACT | 5 refills | Status: AC | PRN

## 2019-10-15 MED ORDER — METOPROLOL TARTRATE 5 MG/5ML IV SOLN
5.0000 mg | Freq: Once | INTRAVENOUS | Status: AC
  Administered 2019-10-15: 5 mg via INTRAVENOUS
  Filled 2019-10-15: qty 5

## 2019-10-15 MED ORDER — PREDNISONE 20 MG PO TABS: 50.0000 mg | ORAL_TABLET | Freq: Once | ORAL | Status: DC

## 2019-10-15 MED ORDER — ACETAMINOPHEN 325 MG PO TABS: 650.0000 mg | ORAL_TABLET | Freq: Four times a day (QID) | ORAL | 2 refills | Status: DC | PRN

## 2019-10-15 MED ORDER — CLONIDINE HCL 0.2 MG PO TABS
0.2000 mg | ORAL_TABLET | Freq: Once | ORAL | Status: AC
  Administered 2019-10-15: 03:00:00 0.2 mg via ORAL
  Filled 2019-10-15: qty 1

## 2019-10-15 MED ORDER — METFORMIN HCL 500 MG PO TABS: 500.0000 mg | ORAL_TABLET | Freq: Two times a day (BID) | ORAL | 5 refills | Status: DC

## 2019-10-15 NOTE — Discharge Summary (Signed)
Gina Walker, is a 39 y.o. female  DOB 04-11-1981  MRN [id number].  Admission date:  10/14/2019  Admitting Physician  Reubin Milan, MD  Discharge Date:  10/15/2019   Primary MD  Patient, No Pcp Per  Recommendations for primary care physician for things to follow:   1) drink plenty of fluids 2) you had a contrast CT scan--- so please hold Metformin until Friday 10/18/2019 3) please use your inhalers and nebulizer treatments as advised  Admission Diagnosis  Acute respiratory distress [R06.03] Severe persistent asthma with exacerbation [J45.51] Asthma exacerbation [J45.901] Severe hypertension [I10]   Discharge Diagnosis  Acute respiratory distress [R06.03] Severe persistent asthma with exacerbation [J45.51] Asthma exacerbation [J45.901] Severe hypertension [I10]    Principal Problem:   Asthma exacerbation Active Problems:   Type 2 diabetes mellitus ([place])   Hyperlipidemia   OBSTRUCTIVE SLEEP APNEA   Hypertensive urgency   Hypokalemia      Past Medical History:  Diagnosis Date  . Allergic rhinitis   . Asthma   . Diabetes mellitus without complication ([place])   . Fibroids   . HTN (hypertension)   . Hyperlipidemia 02/08/2010   Qualifier: Diagnosis of  By: Annamaria Boots MD, Clinton D   . Obesity 04/17/2013  . OBSTRUCTIVE SLEEP APNEA 02/03/2010   Qualifier: Diagnosis of  By: Annamaria Boots MD, Kasandra Knudsen     Past Surgical History:  Procedure Laterality Date  . GUM SURGERY    . TONSILLECTOMY    . WISDOM TOOTH EXTRACTION         HPI  from the history and physical done on the day of admission:      Chief Complaint: Shortness of breath and wheezing.  HPI: Gina Walker is a 39 y.o. female with medical history significant of allergic rhinitis, asthma, type 2 diabetes, uterine fibroids, hypertension, hyperlipidemia, class III obesity with a BMI of 51.55 kg/m, obstructive sleep apnea on CPAP  who is coming to the emergency department due to progressively worse dyspnea associated with rhinorrhea, sneezing, nasal and ocular pruritus, wheezing for the past 3 weeks despite using her Symbicort, montelukast and albuterol as needed.  She has had occasionally productive cough of clear whitish sputum.  She denies fever, chills or night sweats, but complains of fatigue.  She has pleuritic chest pain of lower chest wall bilaterally.  Denies dizziness, palpitations, diaphoresis, lower extremity edema, abdominal pain, nausea, vomiting, diarrhea, constipation, melena or hematochezia.  No dysuria, frequency or hematuria.  No polyuria, polydipsia, polyphagia or blurred vision.  ED Course: Initial vital signs were temperature 98.8 F, pulse 107, respirations 22, blood pressure 176/100 mmHg and O2 sat 100% on nasal cannula oxygen.  The patient received supplemental oxygen, bronchodilators, 125 mg of Solu-Medrol IV and hydralazine 20 mg IVP in the ED.  I added Toradol 30 mg IVP, hydromorphone 1 mg IVP, magnesium and potassium supplementation, Cardizem 20 mg IVP and x1 metoprolol 5 mg.  CBC showed a white count 11.1, hemoglobin 13.2 g/dL and platelets 323.  Potassium was 3.4 mmol/L.  Total protein 8.2 g/dL.  The rest of the CMP values were unremarkable.  SARS coronavirus and influenza A/B PCR was negative.  BNP is 16.0 pg/mL.  Magnesium and phosphorus were normal.  I added a D-dimer which was 0.76.  Her chest radiograph shows minimal streaky basilar opacities likely atelectasis over small area of pneumonia.    Hospital Course:        1) acute asthma exacerbation--improved significantly with steroids and bronchodilators -Post ambulation O2 sats on room air in the mid 90s -Okay to to discharge home on steroids and bronchodilators -CTA chest without pneumonia or PE  2) morbid obesity/OSA--- compliance with CPAP advised,-lifestyle and dietary modification discussed  3)HTN-continue verapamil for both blood  pressure and headache prophylaxis  4)DM2- A1c 6.2 reflecting excellent DM control PTA, anticipate worsening glycemic control with steroids, discharged on Metformin  Discharge Condition: stable  Follow UP  Consults obtained - na  Diet and Activity recommendation:  As advised  Discharge Instructions    Discharge Instructions    Call MD for:  difficulty breathing, headache or visual disturbances   Complete by: As directed    Call MD for:  extreme fatigue   Complete by: As directed    Call MD for:  persistant dizziness or light-headedness   Complete by: As directed    Call MD for:  persistant nausea and vomiting   Complete by: As directed    Call MD for:  severe uncontrolled pain   Complete by: As directed    Call MD for:  temperature >100.4   Complete by: As directed    Diet - low sodium heart healthy   Complete by: As directed    Diet Carb Modified   Complete by: As directed    Discharge instructions   Complete by: As directed    1) drink plenty of fluids 2) you had a contrast CT scan--- so please hold Metformin until Friday 10/18/2019 3) please use your inhalers and nebulizer treatments as advised   Increase activity slowly   Complete by: As directed         Discharge Medications     Allergies as of 10/15/2019      Reactions   Cefuroxime Axetil Anaphylaxis, Itching   Hot feeling all over   Shellfish Allergy Anaphylaxis   Omnicef [cefdinir]       Medication List    STOP taking these medications   budesonide-formoterol 80-4.5 MCG/ACT inhaler Commonly known as: Symbicort Replaced by: budesonide-formoterol 160-4.5 MCG/ACT inhaler   ibuprofen 200 MG tablet Commonly known as: ADVIL   Spiriva Respimat 1.25 MCG/ACT Aers Generic drug: Tiotropium Bromide Monohydrate     TAKE these medications   acetaminophen 325 MG tablet Commonly known as: TYLENOL Take 2 tablets (650 mg total) by mouth every 6 (six) hours as needed for mild pain (or Fever >/= 101).     albuterol (2.5 MG/3ML) 0.083% nebulizer solution Commonly known as: PROVENTIL Take 6 mLs (5 mg total) by nebulization every 6 (six) hours as needed for wheezing or shortness of breath.   albuterol 108 (90 Base) MCG/ACT inhaler Commonly known as: ProAir HFA Inhale 2 puffs into the lungs 4 (four) times daily as needed for wheezing or shortness of breath.   budesonide-formoterol 160-4.5 MCG/ACT inhaler Commonly known as: Symbicort Inhale 2 puffs into the lungs 2 (two) times daily. Replaces: budesonide-formoterol 80-4.5 MCG/ACT inhaler   Junel FE 1/20 1-20 MG-MCG tablet Generic drug: norethindrone-ethinyl estradiol Take 1 tablet by mouth daily.   metFORMIN  500 MG tablet Commonly known as: Glucophage Take 1 tablet (500 mg total) by mouth 2 (two) times daily with a meal. Start taking on: October 18, 2019   montelukast 10 MG tablet Commonly known as: Singulair Take 1 tablet (10 mg total) by mouth at bedtime.   pantoprazole 40 MG tablet Commonly known as: Protonix Take 1 tablet (40 mg total) by mouth daily. Start taking on: October 16, 2019 What changed: additional instructions   predniSONE 20 MG tablet Commonly known as: Deltasone Take 2 tablets (40 mg total) by mouth daily with breakfast.   verapamil 180 MG CR tablet Commonly known as: CALAN-SR Take 1 tablet (180 mg total) by mouth daily. For headache prevention and blood pressure What changed: additional instructions       Major procedures and Radiology Reports - PLEASE review detailed and final reports for all details, in brief -   CT ANGIO CHEST PE W OR WO CONTRAST  Result Date: 10/15/2019 CLINICAL DATA:  Dyspnea. Cough. Elevated D-dimer. EXAM: CT ANGIOGRAPHY CHEST WITH CONTRAST TECHNIQUE: Multidetector CT imaging of the chest was performed using the standard protocol during bolus administration of intravenous contrast. Multiplanar CT image reconstructions and MIPs were obtained to evaluate the vascular anatomy. CONTRAST:   124mL OMNIPAQUE IOHEXOL 350 MG/ML SOLN COMPARISON:  Chest radiograph 10/14/2019 and chest CTA 01/17/2017 FINDINGS: Cardiovascular: Pulmonary arterial opacification is adequate to the segmental level without emboli identified although segmental assessment is intermittently limited by motion artifact. The thoracic aorta is normal in caliber. The heart is normal in size. There is no pericardial effusion. Mediastinum/Nodes: No enlarged axillary, mediastinal, or hilar lymph nodes. Unremarkable thyroid and esophagus. Lungs/Pleura: No pleural effusion or pneumothorax. Mild respiratory motion artifact with mild scarring in the lingula, unchanged from 2018. No mass. Upper Abdomen: Unremarkable. Musculoskeletal: No suspicious osseous lesion. Mild thoracic dextroscoliosis. Review of the MIP images confirms the above findings. IMPRESSION: No evidence of pulmonary emboli or other acute abnormality in the chest. Electronically Signed   By: Logan Bores M.D.   On: 10/15/2019 15:41   DG Chest Port 1 View  Result Date: 10/14/2019 CLINICAL DATA:  Cough and short of breath EXAM: PORTABLE CHEST 1 VIEW COMPARISON:  01/16/2017 FINDINGS: Minimal streaky atelectasis or small infiltrates at the bases. No pleural effusion. Normal heart size. No pneumothorax IMPRESSION: Minimal streaky basilar opacities, favor atelectasis over small pneumonias. Electronically Signed   By: Donavan Foil M.D.   On: 10/14/2019 21:08    Micro Results    Recent Results (from the past 240 hour(s))  Respiratory Panel by RT PCR (Flu A&B, Covid) - Nasopharyngeal Swab     Status: None   Collection Time: 10/14/19 10:55 PM   Specimen: Nasopharyngeal Swab  Result Value Ref Range Status   SARS Coronavirus 2 by RT PCR NEGATIVE NEGATIVE Final    Comment: (NOTE) SARS-CoV-2 target nucleic acids are NOT DETECTED. The SARS-CoV-2 RNA is generally detectable in upper respiratoy specimens during the acute phase of infection. The lowest concentration of  SARS-CoV-2 viral copies this assay can detect is 131 copies/mL. A negative result does not preclude SARS-Cov-2 infection and should not be used as the sole basis for treatment or other patient management decisions. A negative result may occur with  improper specimen collection/handling, submission of specimen other than nasopharyngeal swab, presence of viral mutation(s) within the areas targeted by this assay, and inadequate number of viral copies (<131 copies/mL). A negative result must be combined with clinical observations, patient history, and epidemiological information. The  expected result is Negative. Fact Sheet for Patients:  PinkCheek.be Fact Sheet for Healthcare Providers:  GravelBags.it This test is not yet ap proved or cleared by the [place] FDA and  has been authorized for detection and/or diagnosis of SARS-CoV-2 by FDA under an Emergency Use Authorization (EUA). This EUA will remain  in effect (meaning this test can be used) for the duration of the COVID-19 declaration under Section 564(b)(1) of the Act, 21 U.S.C. section 360bbb-3(b)(1), unless the authorization is terminated or revoked sooner.    Influenza A by PCR NEGATIVE NEGATIVE Final   Influenza B by PCR NEGATIVE NEGATIVE Final    Comment: (NOTE) The Xpert Xpress SARS-CoV-2/FLU/RSV assay is intended as an aid in  the diagnosis of influenza from Nasopharyngeal swab specimens and  should not be used as a sole basis for treatment. Nasal washings and  aspirates are unacceptable for Xpert Xpress SARS-CoV-2/FLU/RSV  testing. Fact Sheet for Patients: PinkCheek.be Fact Sheet for Healthcare Providers: GravelBags.it This test is not yet approved or cleared by the [place] FDA and  has been authorized for detection and/or diagnosis of SARS-CoV-2 by  FDA under an Emergency Use Authorization (EUA).  This EUA will remain  in effect (meaning this test can be used) for the duration of the  Covid-19 declaration under Section 564(b)(1) of the Act, 21  U.S.C. section 360bbb-3(b)(1), unless the authorization is  terminated or revoked. Performed at [facility], [address], [place], [place] [address]        Today   Subjective    Gina Walker today has no new complaints -Ambulated in hallways with O2 sats in the mid 90s on room air -No frank chest pains           Patient has been seen and examined prior to discharge   Objective   Blood pressure (!) 165/99, pulse 100, temperature 98.1 F (36.7 C), resp. rate 20, height 5\' 3"  (1.6 m), weight 130.5 kg, last menstrual period 10/09/2019, SpO2 99 %.   Intake/Output Summary (Last 24 hours) at 10/15/2019 1811 Last data filed at 10/15/2019 0300 Gross per 24 hour  Intake 261.67 ml  Output --  Net 261.67 ml    Exam Gen:- Awake Alert, no acute distress, speaking in sentences HEENT:- [email or web address].AT, No sclera icterus Neck-Supple Neck,No JVD,.  Lungs-improved air movement, no significant wheezing CV- S1, S2 normal, regular Abd-  +ve B.Sounds, Abd Soft, No tenderness, increased truncal adiposity Extremity/Skin:- No  edema,   good pulses Psych-affect is appropriate, oriented x3 Neuro-no new focal deficits, no tremors    Data Review   CBC w Diff:  Lab Results  Component Value Date   WBC 11.7 (H) 10/15/2019   HGB 13.8 10/15/2019   HCT 43.3 10/15/2019   PLT 333 10/15/2019   LYMPHOPCT 24 10/14/2019   MONOPCT 6 10/14/2019   EOSPCT 9 10/14/2019   BASOPCT 0 10/14/2019    CMP:  Lab Results  Component Value Date   NA 135 10/15/2019   K 5.1 10/15/2019   CL 100 10/15/2019   CO2 23 10/15/2019   BUN 10 10/15/2019   CREATININE 1.12 (H) 10/15/2019   PROT 8.4 (H) 10/15/2019   ALBUMIN 3.6 10/15/2019   BILITOT 0.4 10/15/2019   ALKPHOS 65 10/15/2019   AST 26 10/15/2019   ALT 19 10/15/2019  .   Total Discharge time is about 33  minutes  Roxan Hockey M.D on 10/15/2019 at 6:11 PM  Go to [email or web address] -  for contact info  [facility] - Office  [phone]

## 2019-10-15 NOTE — Progress Notes (Signed)
IV removed and discharge instructions reviewed.  Calling family for ride home

## 2019-10-15 NOTE — Progress Notes (Signed)
SATURATION QUALIFICATIONS: (This note is used to comply with regulatory documentation for home oxygen)  Patient Saturations on Room Air at Rest = 98%  Patient Saturations on Room Air while Ambulating = 95%     Does not need O2

## 2021-01-29 ENCOUNTER — Ambulatory Visit
Admission: RE | Admit: 2021-01-29 | Discharge: 2021-01-29 | Disposition: A | Discharge status: Home / Self Care | Payer: 59 | Source: Ambulatory Visit | Attending: Emergency Medicine | Admitting: Emergency Medicine

## 2021-01-29 VITALS — BP 165/100 | HR 94 | Temp 98.7°F | Resp 18

## 2021-01-29 DIAGNOSIS — R030 Elevated blood-pressure reading, without diagnosis of hypertension: Secondary | ICD-10-CM

## 2021-01-29 DIAGNOSIS — M5442 Lumbago with sciatica, left side: Secondary | ICD-10-CM

## 2021-01-29 MED ORDER — DEXAMETHASONE SODIUM PHOSPHATE 10 MG/ML IJ SOLN
10.0000 mg | Freq: Once | INTRAMUSCULAR | Status: AC
  Administered 2021-01-29: 10 mg via INTRAMUSCULAR

## 2021-01-29 MED ORDER — CYCLOBENZAPRINE HCL 10 MG PO TABS: 10.0000 mg | ORAL_TABLET | Freq: Two times a day (BID) | ORAL | 0 refills | Status: DC | PRN

## 2021-01-29 NOTE — Discharge Instructions (Addendum)
Continue conservative management of rest, ice, and gentle stretches Steroid shot given in office Take cyclobenzaprine at nighttime for symptomatic relief. Avoid driving or operating heavy machinery while using medication. Follow up with PCP if symptoms persist Return or go to the ER if you have any new or worsening symptoms (fever, chills, chest pain, abdominal pain, changes in bowel or bladder habits, pain radiating into lower legs, etc...)   Blood pressure elevated in office.  Please recheck in 24 hours.  If it continues to be greater than 140/90 please follow up with PCP for further evaluation and management.

## 2021-01-29 NOTE — ED Triage Notes (Signed)
Upper LT leg and lower LT back  pain after slipping in the shower on Monday.  Had to leave work early.

## 2021-01-29 NOTE — ED Provider Notes (Signed)
Rafael Hernandez   [id number] 01/29/21 Arrival Time: [id number]  CC: Back PAIN  SUBJECTIVE: History from: patient. RUTA OTTERMAN is a 40 y.o. female complains of LT low back pain radiating into LT lower leg x few days.  Slipped in the shower, but caught herself and did not fall.  Localizes the pain to the LT low back.  Describes the pain as intermittent, sharp and achy in character.  Has tried OTC medications without relief.  Symptoms are made worse with movement.  Denies fever, chills, erythema, ecchymosis, effusion, weakness, numbness and tingling, saddle paresthesias, loss of bowel or bladder function.      ROS: As per HPI.  All other pertinent ROS negative.     Past Medical History:  Diagnosis Date   Allergic rhinitis    Asthma    Diabetes mellitus without complication ([place])    Fibroids    HTN (hypertension)    Hyperlipidemia 02/08/2010   Qualifier: Diagnosis of  By: Annamaria Boots MD, Clinton D    Obesity 04/17/2013   OBSTRUCTIVE SLEEP APNEA 02/03/2010   Qualifier: Diagnosis of  By: Annamaria Boots MD, Kasandra Knudsen    Past Surgical History:  Procedure Laterality Date   GUM SURGERY     TONSILLECTOMY     WISDOM TOOTH EXTRACTION     Allergies  Allergen Reactions   Cefuroxime Axetil Anaphylaxis and Itching    Hot feeling all over   Shellfish Allergy Anaphylaxis   Omnicef [Cefdinir]    No current facility-administered medications on file prior to encounter.   Current Outpatient Medications on File Prior to Encounter  Medication Sig Dispense Refill   acetaminophen (TYLENOL) 325 MG tablet Take 2 tablets (650 mg total) by mouth every 6 (six) hours as needed for mild pain (or Fever >/= 101). 12 tablet 2   albuterol (PROAIR HFA) 108 (90 Base) MCG/ACT inhaler Inhale 2 puffs into the lungs 4 (four) times daily as needed for wheezing or shortness of breath. 18 g 5   albuterol (PROVENTIL) (2.5 MG/3ML) 0.083% nebulizer solution Take 6 mLs (5 mg total) by nebulization every 6 (six) hours as needed for  wheezing or shortness of breath. 75 mL 5   budesonide-formoterol (SYMBICORT) 160-4.5 MCG/ACT inhaler Inhale 2 puffs into the lungs 2 (two) times daily. 3 Inhaler 5   JUNEL FE 1/20 1-20 MG-MCG tablet Take 1 tablet by mouth daily. 3 Package 4   metFORMIN (GLUCOPHAGE) 500 MG tablet Take 1 tablet (500 mg total) by mouth 2 (two) times daily with a meal. 90 tablet 5   montelukast (SINGULAIR) 10 MG tablet Take 1 tablet (10 mg total) by mouth at bedtime. 90 tablet 5   pantoprazole (PROTONIX) 40 MG tablet Take 1 tablet (40 mg total) by mouth daily. 30 tablet 0   predniSONE (DELTASONE) 20 MG tablet Take 2 tablets (40 mg total) by mouth daily with breakfast. 10 tablet 0   verapamil (CALAN-SR) 180 MG CR tablet Take 1 tablet (180 mg total) by mouth daily. For headache prevention and blood pressure 30 tablet 0   Social History   Socioeconomic History   Marital status: Single    Spouse name: Not on file   Number of children: 0   Years of education: Not on file   Highest education level: Not on file  Occupational History   Occupation: customer service/sales/CNA  Tobacco Use   Smoking status: Never   Smokeless tobacco: Never  Substance and Sexual Activity   Alcohol use: Yes    Comment: glass  of wine 2x month   Drug use: No   Sexual activity: Yes    Birth control/protection: Condom, Pill  Other Topics Concern   Not on file  Social History Narrative   Not on file   Social Determinants of Health   Financial Resource Strain: Not on file  Food Insecurity: Not on file  Transportation Needs: Not on file  Physical Activity: Not on file  Stress: Not on file  Social Connections: Not on file  Intimate Partner Violence: Not on file   Family History  Problem Relation Age of Onset   Sleep apnea Father    Colon cancer Maternal Grandmother    Heart disease Maternal Grandmother    Cancer Mother        breast   Cancer Maternal Aunt        lung   Clotting disorder Maternal Aunt    Heart disease  Paternal Grandfather     OBJECTIVE:  Vitals:   01/29/21 1657  BP: (!) 177/126  Pulse: 94  Resp: 18  Temp: 98.7 F (37.1 C)  TempSrc: Oral  SpO2: 96%    General appearance: ALERT; in no acute distress.  Head: NCAT Lungs: Normal respiratory effort Musculoskeletal: Back Inspection: Skin warm, dry, clear and intact without obvious erythema, effusion, or ecchymosis.  Palpation: TTP over LT lower back ROM: FROM active and passive Strength: 5/5 hip flexion, 5/5 hip extension Skin: warm and dry Neurologic: Ambulates with minimal difficulty; Sensation intact about the upper/ lower extremities Psychological: alert and cooperative; normal mood and affect  ASSESSMENT & PLAN:  1. Acute left-sided low back pain with left-sided sciatica   2. Elevated blood pressure reading    Meds ordered this encounter  Medications   cyclobenzaprine (FLEXERIL) 10 MG tablet    Sig: Take 1 tablet (10 mg total) by mouth 2 (two) times daily as needed for muscle spasms.    Dispense:  20 tablet    Refill:  0    Order Specific Question:   Supervising Provider    Answer:   Raylene Everts [id number]   dexamethasone (DECADRON) injection 10 mg    Continue conservative management of rest, ice, and gentle stretches Steroid shot given in office Take cyclobenzaprine at nighttime for symptomatic relief. Avoid driving or operating heavy machinery while using medication. Follow up with PCP if symptoms persist Return or go to the ER if you have any new or worsening symptoms (fever, chills, chest pain, abdominal pain, changes in bowel or bladder habits, pain radiating into lower legs, etc...)   Blood pressure elevated in office.  Please recheck in 24 hours.  If it continues to be greater than 140/90 please follow up with PCP for further evaluation and management.     Reviewed expectations re: course of current medical issues. Questions answered. Outlined signs and symptoms indicating need for more acute  intervention. Patient verbalized understanding. After Visit Summary given.     Lestine Box, PA-C 01/29/21 1709

## 2021-05-01 IMAGING — CT CT ANGIO CHEST
2 of 6 series · 19 of 46 positions shown · IV contrast (Omnipaque or Isovue)
Comparison: Chest radiograph 10/14/2019 and chest CTA 01/17/2017

CLINICAL DATA: Dyspnea. Cough. Elevated D-dimer.

EXAM:
CT ANGIOGRAPHY CHEST WITH CONTRAST
TECHNIQUE: Multidetector CT imaging of the chest was performed using the
standard protocol during bolus administration of intravenous
contrast. Multiplanar CT image reconstructions and MIPs were
obtained to evaluate the vascular anatomy.
CONTRAST:  100mL OMNIPAQUE IOHEXOL 350 MG/ML SOLN

[Series 5: pe axial thins · axial · 0.70mm/px · z∈[+1029,+1309]mm · 16 of 308 slices shown]
[im 14/308  lung]
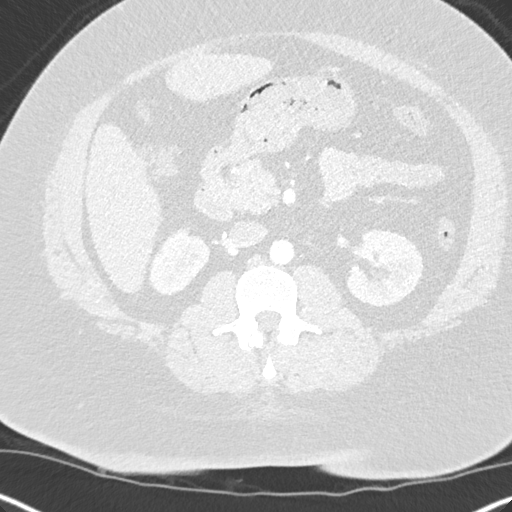
[im 41/308  soft-tissue]
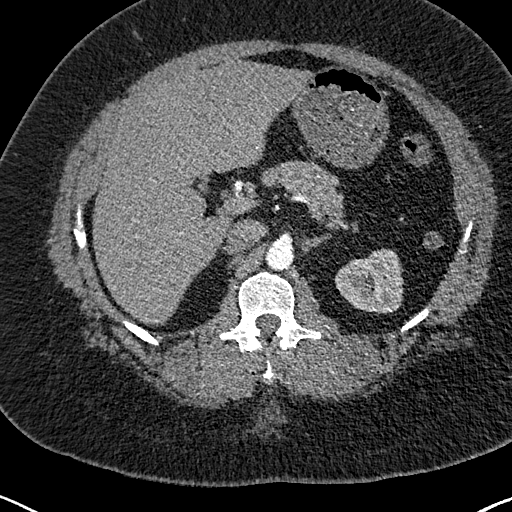
[im 54/308  lung]
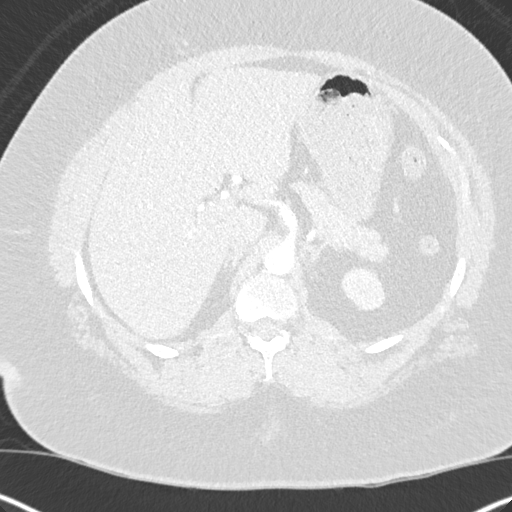
[im 67/308  soft-tissue]
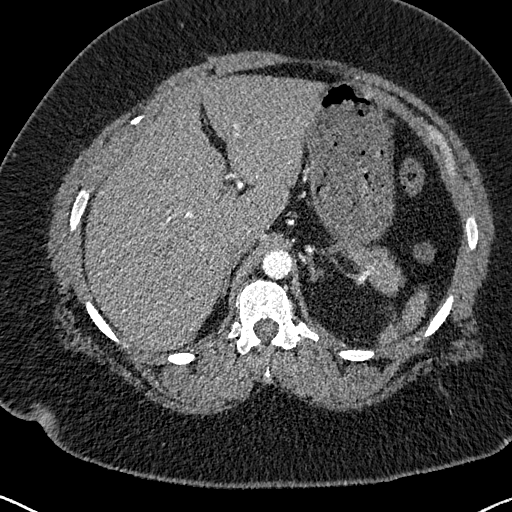
[im 94/308  lung]
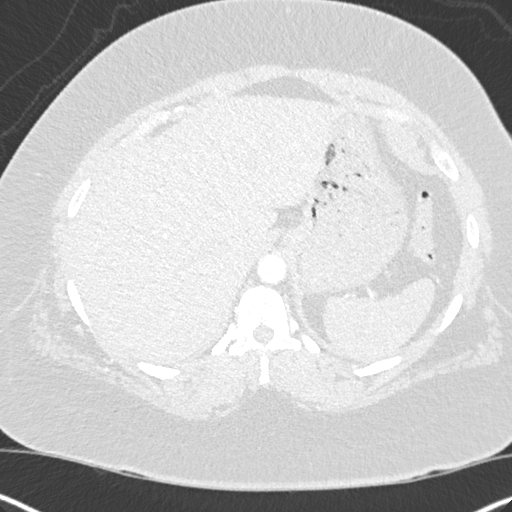
[im 107/308  soft-tissue]
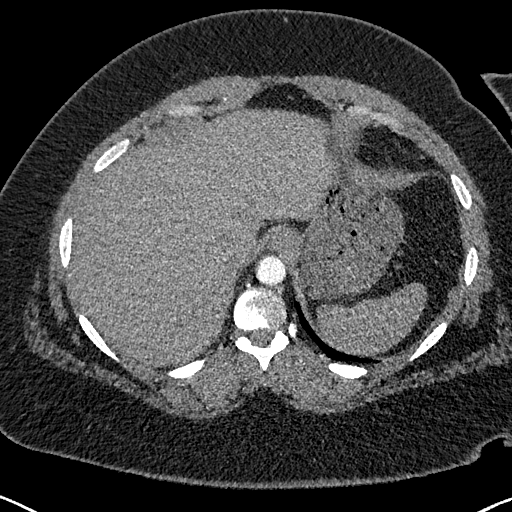
[im 121/308  lung]
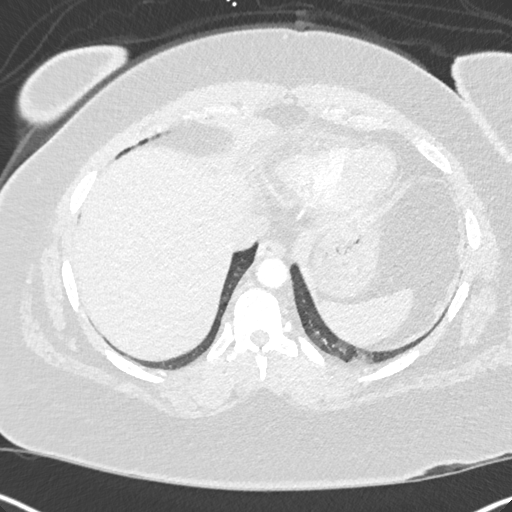
[im 147/308  soft-tissue]
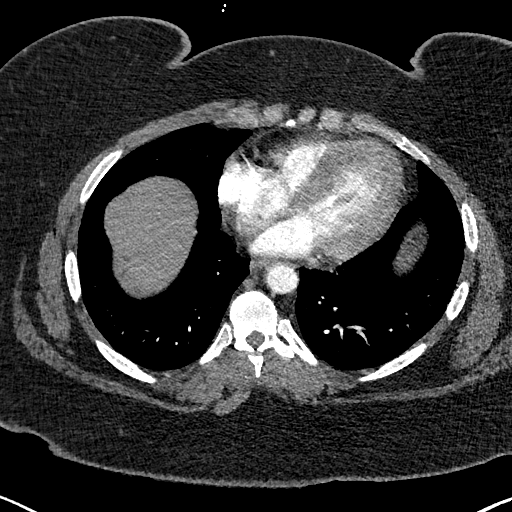
[im 161/308  lung]
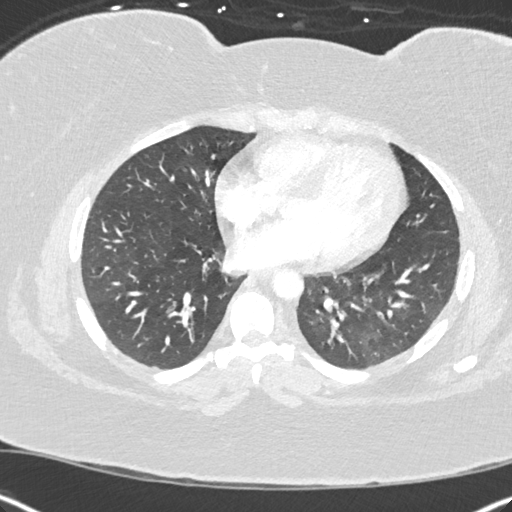
[im 187/308  soft-tissue]
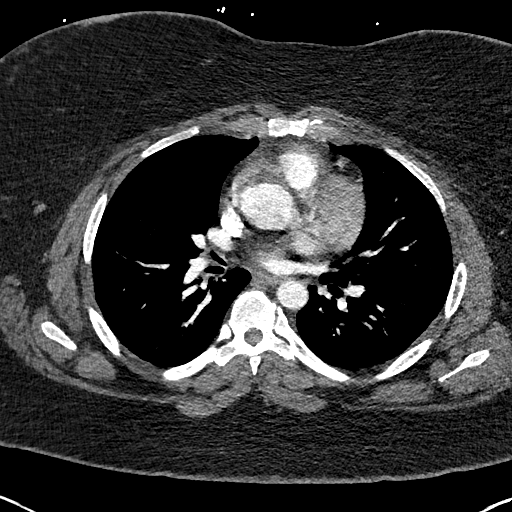
[im 201/308  lung]
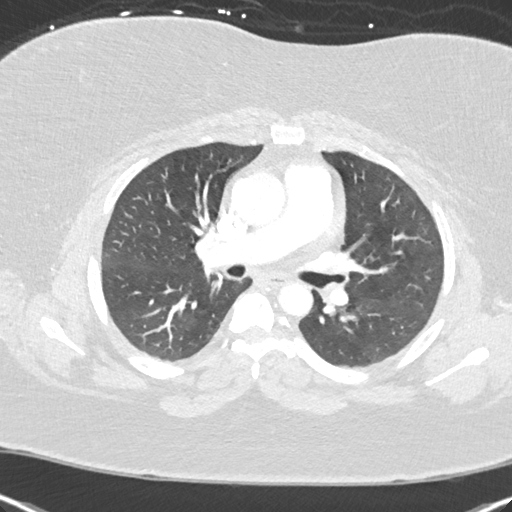
[im 214/308  soft-tissue]
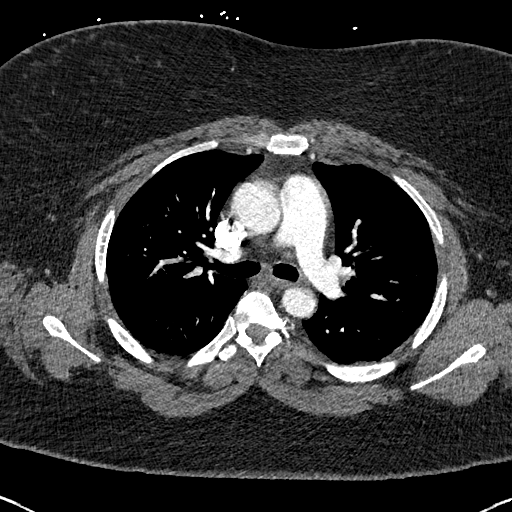
[im 241/308  lung]
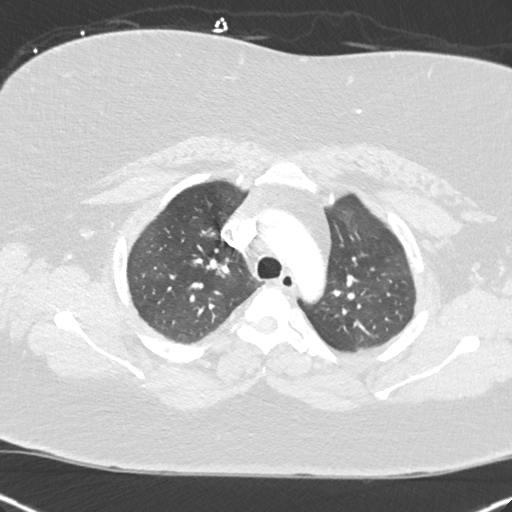
[im 254/308  soft-tissue]
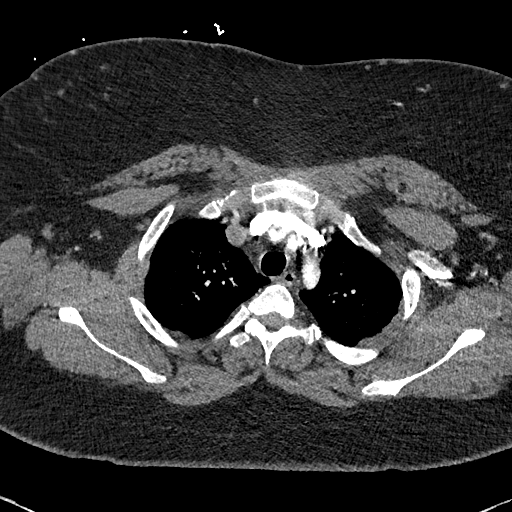
[im 267/308  lung]
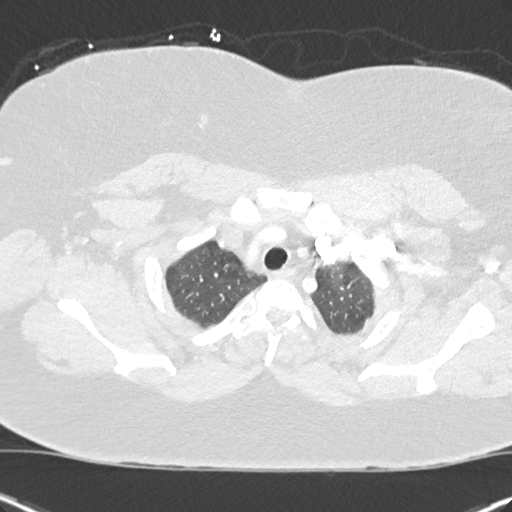
[im 294/308  soft-tissue]
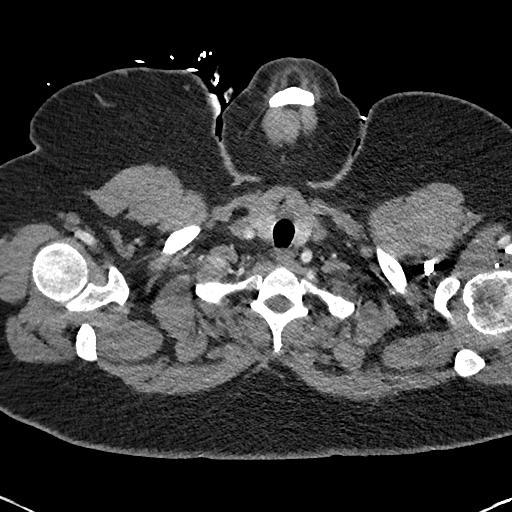

[Series 7: cor soft · coronal · 0.63mm/px · 3 of 191 slices shown]
[im 48/191  soft-tissue]
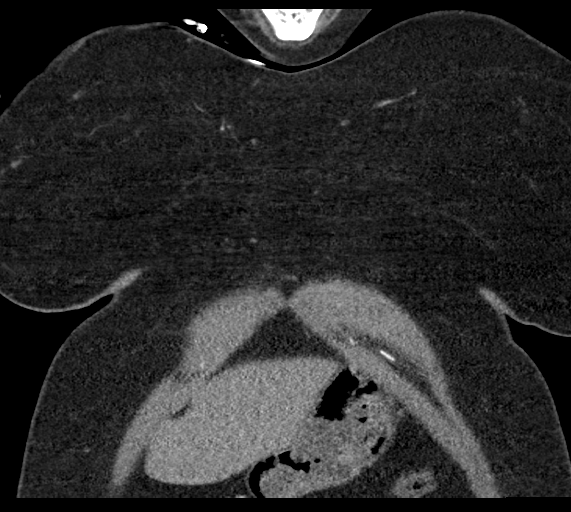
[im 96/191  soft-tissue]
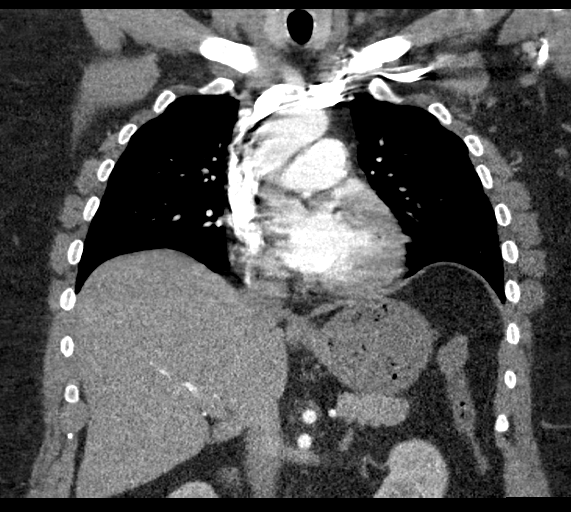
[im 143/191  soft-tissue]
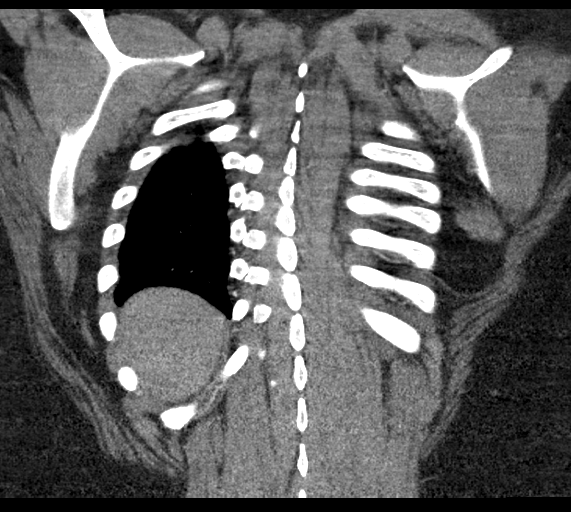

[19 of 46 positions shown; findings below may reference images not displayed]

FINDINGS: Cardiovascular: Pulmonary arterial opacification is adequate to the
segmental level without emboli identified although segmental
assessment is intermittently limited by motion artifact. The
thoracic aorta is normal in caliber. The heart is normal in size.
There is no pericardial effusion.

Mediastinum/Nodes: No enlarged axillary, mediastinal, or hilar lymph
nodes. Unremarkable thyroid and esophagus.

Lungs/Pleura: No pleural effusion or pneumothorax. Mild respiratory
motion artifact with mild scarring in the lingula, unchanged from
8963. No mass.

Upper Abdomen: Unremarkable.

Musculoskeletal: No suspicious osseous lesion. Mild thoracic
dextroscoliosis.

Review of the MIP images confirms the above findings.
IMPRESSION: No evidence of pulmonary emboli or other acute abnormality in the
chest.

## 2021-07-28 ENCOUNTER — Ambulatory Visit
Admission: EM | Admit: 2021-07-28 | Discharge: 2021-07-28 | Disposition: A | Discharge status: Home / Self Care | Payer: 59 | Attending: Urgent Care | Admitting: Urgent Care

## 2021-07-28 ENCOUNTER — Other Ambulatory Visit: Payer: Self-pay

## 2021-07-28 ENCOUNTER — Ambulatory Visit (INDEPENDENT_AMBULATORY_CARE_PROVIDER_SITE_OTHER): Payer: 59

## 2021-07-28 DIAGNOSIS — M545 Low back pain, unspecified: Secondary | ICD-10-CM

## 2021-07-28 DIAGNOSIS — M79642 Pain in left hand: Secondary | ICD-10-CM

## 2021-07-28 DIAGNOSIS — W19XXXA Unspecified fall, initial encounter: Secondary | ICD-10-CM | POA: Diagnosis not present

## 2021-07-28 DIAGNOSIS — M25562 Pain in left knee: Secondary | ICD-10-CM

## 2021-07-28 DIAGNOSIS — S60222A Contusion of left hand, initial encounter: Secondary | ICD-10-CM

## 2021-07-28 DIAGNOSIS — S8002XA Contusion of left knee, initial encounter: Secondary | ICD-10-CM

## 2021-07-28 MED ORDER — TIZANIDINE HCL 4 MG PO TABS: 4.0000 mg | ORAL_TABLET | Freq: Every day | ORAL | 0 refills | Status: DC

## 2021-07-28 MED ORDER — NAPROXEN 500 MG PO TABS: 500.0000 mg | ORAL_TABLET | Freq: Two times a day (BID) | ORAL | 0 refills | Status: DC

## 2021-07-28 NOTE — ED Provider Notes (Signed)
[place]   MRN: [id number] DOB: [birth year]  Subjective:   Gina Walker is a 41 y.o. female presenting for suffering a fall today.  Patient was walking outside when she slipped in mud and ended up hitting herself against the wooden rail making impact with her knee.  And then she landed on an outstretched hand on the left hand.  She has had persistent left hand and knee pain that is moderate to severe.  Has had some swelling of both.  Has also had some low back pain back to a much lesser degree.  No head injury, loss conscious, confusion.  No current facility-administered medications for this encounter.  Current Outpatient Medications:    acetaminophen (TYLENOL) 325 MG tablet, Take 2 tablets (650 mg total) by mouth every 6 (six) hours as needed for mild pain (or Fever >/= 101)., Disp: 12 tablet, Rfl: 2   albuterol (PROAIR HFA) 108 (90 Base) MCG/ACT inhaler, Inhale 2 puffs into the lungs 4 (four) times daily as needed for wheezing or shortness of breath., Disp: 18 g, Rfl: 5   albuterol (PROVENTIL) (2.5 MG/3ML) 0.083% nebulizer solution, Take 6 mLs (5 mg total) by nebulization every 6 (six) hours as needed for wheezing or shortness of breath., Disp: 75 mL, Rfl: 5   budesonide-formoterol (SYMBICORT) 160-4.5 MCG/ACT inhaler, Inhale 2 puffs into the lungs 2 (two) times daily., Disp: 3 Inhaler, Rfl: 5   cyclobenzaprine (FLEXERIL) 10 MG tablet, Take 1 tablet (10 mg total) by mouth 2 (two) times daily as needed for muscle spasms., Disp: 20 tablet, Rfl: 0   JUNEL FE 1/20 1-20 MG-MCG tablet, Take 1 tablet by mouth daily., Disp: 3 Package, Rfl: 4   metFORMIN (GLUCOPHAGE) 500 MG tablet, Take 1 tablet (500 mg total) by mouth 2 (two) times daily with a meal., Disp: 90 tablet, Rfl: 5   montelukast (SINGULAIR) 10 MG tablet, Take 1 tablet (10 mg total) by mouth at bedtime., Disp: 90 tablet, Rfl: 5   pantoprazole (PROTONIX) 40 MG tablet, Take 1 tablet (40 mg total) by mouth daily., Disp: 30  tablet, Rfl: 0   predniSONE (DELTASONE) 20 MG tablet, Take 2 tablets (40 mg total) by mouth daily with breakfast., Disp: 10 tablet, Rfl: 0   verapamil (CALAN-SR) 180 MG CR tablet, Take 1 tablet (180 mg total) by mouth daily. For headache prevention and blood pressure, Disp: 30 tablet, Rfl: 0   Allergies  Allergen Reactions   Cefuroxime Axetil Anaphylaxis and Itching    Hot feeling all over   Shellfish Allergy Anaphylaxis   Omnicef [Cefdinir]     Past Medical History:  Diagnosis Date   Allergic rhinitis    Asthma    Diabetes mellitus without complication ([place])    Fibroids    HTN (hypertension)    Hyperlipidemia 02/08/2010   Qualifier: Diagnosis of  By: Annamaria Boots MD, Clinton D    Obesity 04/17/2013   OBSTRUCTIVE SLEEP APNEA 02/03/2010   Qualifier: Diagnosis of  By: Annamaria Boots MD, Kasandra Knudsen      Past Surgical History:  Procedure Laterality Date   GUM SURGERY     TONSILLECTOMY     WISDOM TOOTH EXTRACTION      Family History  Problem Relation Age of Onset   Sleep apnea Father    Colon cancer Maternal Grandmother    Heart disease Maternal Grandmother    Cancer Mother        breast   Cancer Maternal Aunt        lung  Clotting disorder Maternal Aunt    Heart disease Paternal Grandfather     Social History   Tobacco Use   Smoking status: Never   Smokeless tobacco: Never  Vaping Use   Vaping Use: Never used  Substance Use Topics   Alcohol use: Yes    Comment: glass of wine 2x month   Drug use: No    ROS   Objective:   Vitals: BP (!) 154/99 (BP Location: Right Arm)    Pulse 67    Temp 97.9 F (36.6 C)    Resp 18    LMP 07/04/2021 (Approximate)    SpO2 98%   Physical Exam Constitutional:      General: She is not in acute distress.    Appearance: Normal appearance. She is well-developed. She is obese. She is not ill-appearing, toxic-appearing or diaphoretic.  HENT:     Head: Normocephalic and atraumatic.     Nose: Nose normal.     Mouth/Throat:     Mouth: Mucous  membranes are moist.  Eyes:     General: No scleral icterus.       Right eye: No discharge.        Left eye: No discharge.     Extraocular Movements: Extraocular movements intact.  Cardiovascular:     Rate and Rhythm: Normal rate.  Pulmonary:     Effort: Pulmonary effort is normal.  Musculoskeletal:     Left hand: Swelling, tenderness and bony tenderness present. No deformity or lacerations. Decreased range of motion. Normal capillary refill.       Hands:     Left knee: Swelling and bony tenderness present. No deformity, effusion, erythema, ecchymosis, lacerations or crepitus. Decreased range of motion. Tenderness present over the medial joint line and patellar tendon. No lateral joint line tenderness. Normal alignment and normal patellar mobility.     Comments: Patient is guarding significantly and did not allow for thorough exam.  She does ambulate without assistance, limps favoring the left knee.  Skin:    General: Skin is warm and dry.  Neurological:     General: No focal deficit present.     Mental Status: She is alert and oriented to person, place, and time.  Psychiatric:        Mood and Affect: Mood normal.        Behavior: Behavior normal.    DG Knee Complete 4 Views Left  Result Date: 07/28/2021 CLINICAL DATA:  Fall EXAM: LEFT KNEE - COMPLETE 4+ VIEW COMPARISON:  Left knee radiographs 06/30/2006 FINDINGS: There is no acute fracture or dislocation. Knee alignment is normal. There is mild medial and lateral joint space narrowing with minimal associated osteophytosis. The soft tissues are unremarkable. There is no effusion. IMPRESSION: No acute fracture or dislocation. Electronically Signed   By: Valetta Mole M.D.   On: 07/28/2021 11:32   DG Hand Complete Left  Result Date: 07/28/2021 CLINICAL DATA:  Left hand pain and swelling.  Status post fall EXAM: LEFT HAND - COMPLETE 3+ VIEW COMPARISON:  None. FINDINGS: There is no evidence of fracture or dislocation. There is no evidence of  arthropathy or other focal bone abnormality. Soft tissues are unremarkable. IMPRESSION: Negative. Electronically Signed   By: Kerby Moors M.D.   On: 07/28/2021 11:33     Assessment and Plan :   PDMP not reviewed this encounter.  1. Acute pain of left knee   2. Left hand pain   3. Acute bilateral low back pain without sciatica  4. Accidental fall, initial encounter   5. Contusion of left knee, initial encounter   6. Contusion of left hand, initial encounter    Recommended conservative management for contusion of the left hand and knee.  Use RICE method, naproxen for pain and inflammation. Counseled patient on potential for adverse effects with medications prescribed/recommended today, ER and return-to-clinic precautions discussed, patient verbalized understanding.    Jaynee Eagles, PA-C 07/28/21 1140

## 2021-07-28 NOTE — ED Triage Notes (Signed)
Patient states she fell this morning in some mud.  Patient states she fell on her left leg and left wrist and back is hurting  Patient states that her knee and her fingers on the left side are swollen

## 2021-12-21 ENCOUNTER — Ambulatory Visit
Admission: EM | Admit: 2021-12-21 | Discharge: 2021-12-21 | Disposition: A | Discharge status: Home / Self Care | Payer: 59

## 2021-12-21 DIAGNOSIS — H66001 Acute suppurative otitis media without spontaneous rupture of ear drum, right ear: Secondary | ICD-10-CM

## 2021-12-21 DIAGNOSIS — R42 Dizziness and giddiness: Secondary | ICD-10-CM | POA: Diagnosis not present

## 2021-12-21 DIAGNOSIS — H9311 Tinnitus, right ear: Secondary | ICD-10-CM

## 2021-12-21 DIAGNOSIS — R110 Nausea: Secondary | ICD-10-CM

## 2021-12-21 MED ORDER — ONDANSETRON 4 MG PO TBDP: 4.0000 mg | ORAL_TABLET | Freq: Three times a day (TID) | ORAL | 0 refills | Status: DC | PRN

## 2021-12-21 MED ORDER — AZITHROMYCIN 250 MG PO TABS: ORAL_TABLET | ORAL | 0 refills | Status: DC

## 2021-12-21 MED ORDER — PREDNISONE 50 MG PO TABS: ORAL_TABLET | ORAL | 0 refills | Status: DC

## 2021-12-21 MED ORDER — FLUTICASONE PROPIONATE 50 MCG/ACT NA SUSP: 1.0000 | Freq: Two times a day (BID) | NASAL | 2 refills | Status: DC

## 2021-12-21 NOTE — ED Triage Notes (Signed)
Pt reports right ear heaviness, ringing and lightheadedness x 2 days.

## 2022-05-03 ENCOUNTER — Encounter (INDEPENDENT_AMBULATORY_CARE_PROVIDER_SITE_OTHER): Payer: Self-pay | Admitting: Family Medicine

## 2022-05-03 ENCOUNTER — Ambulatory Visit (INDEPENDENT_AMBULATORY_CARE_PROVIDER_SITE_OTHER): Payer: 59 | Admitting: Family Medicine

## 2022-05-03 VITALS — BP 166/100 | HR 101 | Temp 99.1°F | Ht 63.0 in | Wt 277.0 lb

## 2022-05-03 DIAGNOSIS — N926 Irregular menstruation, unspecified: Secondary | ICD-10-CM | POA: Diagnosis not present

## 2022-05-03 DIAGNOSIS — R7303 Prediabetes: Secondary | ICD-10-CM | POA: Insufficient documentation

## 2022-05-03 DIAGNOSIS — Z6841 Body Mass Index (BMI) 40.0 and over, adult: Secondary | ICD-10-CM

## 2022-05-03 DIAGNOSIS — E669 Obesity, unspecified: Secondary | ICD-10-CM

## 2022-05-03 DIAGNOSIS — Z0289 Encounter for other administrative examinations: Secondary | ICD-10-CM

## 2022-05-03 DIAGNOSIS — I10 Essential (primary) hypertension: Secondary | ICD-10-CM | POA: Insufficient documentation

## 2022-05-12 NOTE — Progress Notes (Signed)
Office: [phone]  /  Fax: [phone]   Initial Visit  Gina Walker was seen in clinic today to evaluate for obesity. She is interested in losing weight to improve overall health and reduce the risk of weight related complications. She presents today to review program treatment options, initial physical assessment, and evaluation.     She was referred by: Specialist  When asked what else they would like to accomplish? She states: Adopt healthier eating patterns, Improve existing medical conditions, and Improve quality of life  When asked how has your weight affected you? She states: Having poor endurance  Some associated conditions: Hypertension, OSA, and Prediabetes  Contributing factors: Other: N/A  Weight promoting medications identified: Contraceptives or hormonal therapy  Current nutrition plan: Portion control / smart choices, recently cut out soda.   Current level of physical activity: Step counting  Current or previous pharmacotherapy: None  Response to medication: Never tried medications   Past medical history includes:   Past Medical History:  Diagnosis Date   Allergic rhinitis    Asthma    Diabetes mellitus without complication ([place])    Fibroids    HTN (hypertension)    Hyperlipidemia 02/08/2010   Qualifier: Diagnosis of  By: Annamaria Boots MD, Clinton D    Obesity 04/17/2013   OBSTRUCTIVE SLEEP APNEA 02/03/2010   Qualifier: Diagnosis of  By: Annamaria Boots MD, Clinton D    Objective:   BP (!) 166/100   Pulse (!) 101   Temp 99.1 F (37.3 C)   Ht '5\' 3"'$  (1.6 m)   Wt 277 lb (125.6 kg)   SpO2 95%   BMI 49.07 kg/m  She was weighed on the bioimpedance scale: Body mass index is 49.07 kg/m.  Visceral Fat Rating:17, Body Fat%:50.1  General:  Alert, oriented and cooperative. Patient is in no acute distress.  Respiratory: Normal respiratory effort, no problems with respiration noted  Extremities: Normal range of motion.    Mental Status: Normal mood and affect.  Normal behavior. Normal judgment and thought content.   Assessment and Plan:  1. Pre-diabetes She is off metformin.  Had diarrhea now off.  Denies sugar cravings.   Plan to update A1c with labs.  Congratulated on reducing SSB's.  2. Irregular menses She is seeing Dr Gina Walker, using progesterone for a withdrawal bleed.  Had recent labs with OBGYN.   Review recent labs at next visit to rule out thyroid dysfunction, PCOS.    3. Essential hypertension Blood pressure elevated today.  She recently started chlorthalidone and Nifedipine. She is taking both medications as directed without side effects.  She has stopped her CPAP, her dog ate the mask.   Continue blood pressure medications and follow up with Dr Gina Walker for CPAP mask.   4. Obesity,current BMI 49.1 1) Reviewed obesity as a disease. 2) Reviewed information about our program. 3) Reviewed today's Bioimpedance results.  4) Reviewed personal goals and her weight story.   We reviewed weight, biometrics, associated medical conditions and contributing factors with patient. She would benefit from weight loss therapy via a modified calorie, low-carb, high-protein nutritional plan tailored to their REE (resting energy expenditure) which will be determined by indirect calorimetry.  We will also assess for cardiometabolic risk and nutritional derangements via fasting serologies at her next appointment.     Obesity Treatment / Action Plan:  Patient will work on garnering support from family and friends to begin weight loss journey. Will work on reducing intake of added sugars, simple sugars and processed carbs.  Will reduce liquid calories and sugary drinks from diet. Was counseled on nutritional approaches to weight loss and benefits of complex carbs and high quality protein as part of nutritional weight management. Was counseled on pharmacotherapy and role as an adjunct in weight management.   Obesity Education Performed Today:  She was  weighed on the bioimpedance scale and results were discussed and documented in the synopsis.  We discussed obesity as a disease and the importance of a more detailed evaluation of all the factors contributing to the disease.  We discussed the importance of long term lifestyle changes which include nutrition, exercise and behavioral modifications as well as the importance of customizing this to her specific health and social needs.  We discussed the benefits of reaching a healthier weight to alleviate the symptoms of existing conditions and reduce the risks of the biomechanical, metabolic and psychological effects of obesity.  Gina Walker appears to be in the action stage of change and states they are ready to start intensive lifestyle modifications and behavioral modifications.  30 minutes was spent today on this visit including the above counseling, pre-visit chart review, and post-visit documentation.  Reviewed by clinician on day of visit: allergies, medications, problem list, medical history, surgical history, family history, social history, and previous encounter notes.  I, Davy Pique, am acting as Location manager for Loyal Gambler, DO.   I have reviewed the above documentation for accuracy and completeness, and I agree with the above. Dell Ponto, DO

## 2022-05-26 ENCOUNTER — Encounter (INDEPENDENT_AMBULATORY_CARE_PROVIDER_SITE_OTHER): Payer: Self-pay | Admitting: Family Medicine

## 2022-05-26 ENCOUNTER — Ambulatory Visit (INDEPENDENT_AMBULATORY_CARE_PROVIDER_SITE_OTHER): Payer: 59 | Admitting: Family Medicine

## 2022-05-26 VITALS — BP 136/86 | HR 78 | Temp 98.8°F | Ht 63.0 in | Wt 276.0 lb

## 2022-05-26 DIAGNOSIS — R7303 Prediabetes: Secondary | ICD-10-CM

## 2022-05-26 DIAGNOSIS — Z6841 Body Mass Index (BMI) 40.0 and over, adult: Secondary | ICD-10-CM

## 2022-05-26 DIAGNOSIS — F419 Anxiety disorder, unspecified: Secondary | ICD-10-CM

## 2022-05-26 DIAGNOSIS — R5383 Other fatigue: Secondary | ICD-10-CM | POA: Insufficient documentation

## 2022-05-26 DIAGNOSIS — I10 Essential (primary) hypertension: Secondary | ICD-10-CM

## 2022-05-26 DIAGNOSIS — G4733 Obstructive sleep apnea (adult) (pediatric): Secondary | ICD-10-CM

## 2022-05-26 DIAGNOSIS — I517 Cardiomegaly: Secondary | ICD-10-CM

## 2022-05-26 DIAGNOSIS — E669 Obesity, unspecified: Secondary | ICD-10-CM

## 2022-05-26 DIAGNOSIS — R0602 Shortness of breath: Secondary | ICD-10-CM | POA: Diagnosis not present

## 2022-05-26 DIAGNOSIS — F32A Depression, unspecified: Secondary | ICD-10-CM | POA: Diagnosis not present

## 2022-05-26 DIAGNOSIS — Z1331 Encounter for screening for depression: Secondary | ICD-10-CM

## 2022-05-27 LAB — CBC WITH DIFFERENTIAL/PLATELET
Basophils Absolute: 0.1 10*3/uL (ref 0.0–0.2)
Basos: 1 %
EOS (ABSOLUTE): 0 10*3/uL (ref 0.0–0.4)
Eos: 0 %
Hematocrit: 40.8 % (ref 34.0–46.6)
Hemoglobin: 13.7 g/dL (ref 11.1–15.9)
Immature Grans (Abs): 0.1 10*3/uL (ref 0.0–0.1)
Immature Granulocytes: 1 %
Lymphocytes Absolute: 2.5 10*3/uL (ref 0.7–3.1)
Lymphs: 34 %
MCH: 28.3 pg (ref 26.6–33.0)
MCHC: 33.6 g/dL (ref 31.5–35.7)
MCV: 84 fL (ref 79–97)
Monocytes Absolute: 0.5 10*3/uL (ref 0.1–0.9)
Monocytes: 6 %
Neutrophils Absolute: 4.2 10*3/uL (ref 1.4–7.0)
Neutrophils: 58 %
Platelets: 271 10*3/uL (ref 150–450)
RBC: 4.84 x10E6/uL (ref 3.77–5.28)
RDW: 14 % (ref 11.7–15.4)
WBC: 7.3 10*3/uL (ref 3.4–10.8)

## 2022-05-27 LAB — COMPREHENSIVE METABOLIC PANEL
ALT: 23 IU/L (ref 0–32)
AST: 21 IU/L (ref 0–40)
Albumin/Globulin Ratio: 1.6 (ref 1.2–2.2)
Albumin: 4.4 g/dL (ref 3.9–4.9)
Alkaline Phosphatase: 79 IU/L (ref 44–121)
BUN/Creatinine Ratio: 10 (ref 9–23)
BUN: 11 mg/dL (ref 6–24)
Bilirubin Total: 0.2 mg/dL (ref 0.0–1.2)
CO2: 27 mmol/L (ref 20–29)
Calcium: 9.2 mg/dL (ref 8.7–10.2)
Chloride: 99 mmol/L (ref 96–106)
Creatinine, Ser: 1.11 mg/dL — ABNORMAL HIGH (ref 0.57–1.00)
Globulin, Total: 2.7 g/dL (ref 1.5–4.5)
Glucose: 92 mg/dL (ref 70–99)
Potassium: 3.4 mmol/L — ABNORMAL LOW (ref 3.5–5.2)
Sodium: 140 mmol/L (ref 134–144)
Total Protein: 7.1 g/dL (ref 6.0–8.5)
eGFR: 64 mL/min/{1.73_m2} (ref >59)

## 2022-05-27 LAB — HEMOGLOBIN A1C
Est. average glucose Bld gHb Est-mCnc: 131 mg/dL
Hgb A1c MFr Bld: 6.2 % — ABNORMAL HIGH (ref 4.8–5.6)

## 2022-05-27 LAB — FOLATE: Folate: 5 ng/mL (ref >3.0)

## 2022-05-27 LAB — TSH: TSH: 1.49 u[IU]/mL (ref 0.450–4.500)

## 2022-05-27 LAB — LIPID PANEL
Chol/HDL Ratio: 3.8 ratio (ref 0.0–4.4)
Cholesterol, Total: 237 mg/dL — ABNORMAL HIGH (ref 100–199)
HDL: 62 mg/dL (ref >39)
LDL Chol Calc (NIH): 159 mg/dL — ABNORMAL HIGH (ref 0–99)
Triglycerides: 90 mg/dL (ref 0–149)
VLDL Cholesterol Cal: 16 mg/dL (ref 5–40)

## 2022-05-27 LAB — T4, FREE: Free T4: 1.34 ng/dL (ref 0.82–1.77)

## 2022-05-27 LAB — VITAMIN B12: Vitamin B-12: 319 pg/mL (ref 232–1245)

## 2022-05-27 LAB — INSULIN, RANDOM: INSULIN: 32.5 u[IU]/mL — ABNORMAL HIGH (ref 2.6–24.9)

## 2022-05-27 LAB — VITAMIN D 25 HYDROXY (VIT D DEFICIENCY, FRACTURES): Vit D, 25-Hydroxy: 11.7 ng/mL — ABNORMAL LOW (ref 30.0–100.0)
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
## 2022-06-09 ENCOUNTER — Ambulatory Visit (INDEPENDENT_AMBULATORY_CARE_PROVIDER_SITE_OTHER): Payer: 59 | Admitting: Family Medicine

## 2022-06-09 ENCOUNTER — Encounter (INDEPENDENT_AMBULATORY_CARE_PROVIDER_SITE_OTHER): Payer: Self-pay | Admitting: Family Medicine

## 2022-06-09 VITALS — BP 114/97 | HR 104 | Temp 98.4°F | Ht 63.0 in | Wt 275.0 lb

## 2022-06-09 DIAGNOSIS — R7303 Prediabetes: Secondary | ICD-10-CM | POA: Diagnosis not present

## 2022-06-09 DIAGNOSIS — Z6841 Body Mass Index (BMI) 40.0 and over, adult: Secondary | ICD-10-CM

## 2022-06-09 DIAGNOSIS — E7849 Other hyperlipidemia: Secondary | ICD-10-CM

## 2022-06-09 DIAGNOSIS — E876 Hypokalemia: Secondary | ICD-10-CM | POA: Diagnosis not present

## 2022-06-09 DIAGNOSIS — E669 Obesity, unspecified: Secondary | ICD-10-CM

## 2022-06-09 DIAGNOSIS — I10 Essential (primary) hypertension: Secondary | ICD-10-CM

## 2022-06-09 DIAGNOSIS — E559 Vitamin D deficiency, unspecified: Secondary | ICD-10-CM

## 2022-06-09 MED ORDER — METFORMIN HCL 500 MG PO TABS: 500.0000 mg | ORAL_TABLET | Freq: Every day | ORAL | 0 refills | Status: DC

## 2022-06-09 MED ORDER — VITAMIN D (ERGOCALCIFEROL) 1.25 MG (50000 UNIT) PO CAPS: 50000.0000 [IU] | ORAL_CAPSULE | ORAL | 0 refills | Status: DC

## 2022-06-15 NOTE — H&P (Signed)
Gina Walker is a 41 y.o. female G:0 is here for hysteroscopy with dilatation and curettage because of post menopausal bleeding. The patient was amenorrheic for over a year and was given a progestin challenge. Initially she did not have a response to that challenge and her  [facility] returned a value of 107-menopausal. In recent weeks however, the patient  began to have some vaginal bleeding  with  a pad change every 3 hours and was managed with norethindrone 10 mg daily.   A  pelvic ultrasound on 06/14/2022 revealed a uterine volume of 301 cc; 11.3 x 7.9 x 8.4 cm, endometrium: 12.91 mm but sub-optimal view due to multiple fibroids; # 4 fibroids: fundal intramural displacing endometrium: 5.2 cm; anterior lateral intramural: 1.8 cm, midline intramural vs submucosal: 1.7 cm and fundal sub-serosal: 4.5 cm; limited view of both ovaries but appear normal.  An endometrial biopsy done 05/17/2022 was benign. A review of ultrasound findings were discussed with the patient along with the risks associated with post menopausal bleeding, a thickened endometrium and the limitations of her endometrial biopsy given her multiple fibroid uterus.  The patient has consented to proceed with further evaluation and management of her post menopausal bleeding with hysteroscopy with dilatation and curettage.   Past Medical History  OB History: G:0  GYN History: menarche: 41 YO;   Denies history of abnormal PAP smear.   Last PAP smear: 2019-normal with negative HPV  Medical History: Hypertension, Asthma and Uterine Fibroids  Surgical History: none   Family History: Hypercholesterolemia, Breast Cancer, Lung Cancer, Diabetes Mellitus and Hypertension  Social History:   Single and employed with [employer]; Denies tobacco use and occasionally consumes alcohol   Medications: Chlorthalidone 25 mg daily Fasenra Pen 30 mg Sub-Q Auto Injector Singulair 10 mg daily Nifedifpine 60 mg ER daily Norethindrone 5 mg  #2 tablets  po daily Spiriva Respimat 125 mcg/actuation solution for inhalation Symbicort 160 mg - 4.5 mcg/actuation HFA aerosol inhaler  Allergies  Allergen Reactions   Cefuroxime Axetil Anaphylaxis and Itching    Hot feeling all over   Shellfish Allergy Anaphylaxis   Omnicef [Cefdinir]     ROS: Denies headache, vision changes, nasal congestion, dysphagia, tinnitus, dizziness, hoarseness, cough,  chest pain, shortness of breath, nausea, vomiting, diarrhea,constipation,  urinary frequency, urgency  dysuria, hematuria, vaginitis symptoms, pelvic pain, swelling of joints,easy bruising,  myalgias, arthralgias, skin rashes, unexplained weight loss and except as is mentioned in the history of present illness, patient's review of systems is otherwise negative.    Physical Exam  Bp: 152/102;  Weight: 280 lbs.;  Height: 5'3"'  BMI:49.6  Neck: supple without masses or thyromegaly Lungs: clear to auscultation Heart: regular rate and rhythm Abdomen: soft, non-tender and no organomegaly Pelvic:EGBUS- wnl; vagina-normal rugae; uterus-upper limits of normal size,but exam limited by habitus;  cervix without lesions or motion tenderness; adnexae-no tenderness or masses Extremities:  no clubbing, cyanosis or edema   Assesment:  Post Menopausal Bleeding    Thickened Endometrium   Disposition:  A discussion was held with patient regarding the indication for her procedure(s) along with the risks, which include but are not limited to: reaction to anesthesia, damage to adjacent organs, infection and excessive bleeding. The patient verbalized understanding of these risks and has consented to proceed with Hysteroscopy, Dilatation and Curettage at [facility] on December 27/2023.   CSN# [id number]   Harlan Vinal J. Florene Glen, PA-C  for Dr. Dede Query. [place]

## 2022-06-16 ENCOUNTER — Encounter (HOSPITAL_COMMUNITY): Payer: Self-pay | Admitting: Obstetrics and Gynecology

## 2022-06-16 NOTE — Progress Notes (Signed)
COVID Vaccine Completed: yes  Date of COVID positive in last 90 days: no  PCP - Allyn Kenner, MD Cardiologist - Orpah Cobb, MD LOV 08/02/17 for CP, SOB, and palpitations. Has appointment with new cardiologist 07/26/21 per weight loss MD rec  Chest x-ray - n/a EKG - 05/26/22 Epic Stress Test - n/a ECHO - 2018 Cardiac Cath - n/a Pacemaker/ICD device last checked: n/a Spinal Cord Stimulator: n/a  Bowel Prep - no  Sleep Study - yes CPAP - yes every night   Fasting Blood Sugar - pre DM per pt, no checks at home  Checks Blood Sugar   Last dose of GLP1 agonist-  N/A GLP1 instructions:  N/A   Last dose of SGLT-2 inhibitors-  N/A SGLT-2 instructions: N/A   Blood Thinner Instructions: n/a Aspirin Instructions: Last Dose:  Activity level: Can go up a flight of stairs and perform activities of daily living without stopping and without symptoms of chest pain or shortness of breath.   Anesthesia review: HTN, LVH, OSA, asthma, DM2, COPD, palpitations 2018  Patient denies shortness of breath, fever, cough and chest pain at PAT appointment  Patient verbalized understanding of instructions that were given to them at the PAT appointment. Patient was also instructed that they will need to review over the PAT instructions again at home before surgery.

## 2022-06-16 NOTE — Progress Notes (Signed)
Chief Complaint:   OBESITY Gina Walker is here to discuss her progress with her obesity treatment plan along with follow-up of her obesity related diagnoses. Gina Walker is on the Category 3 Plan and states she is following her eating plan approximately 60% of the time. Gina Walker states she is not exercising.  Today's visit was #: 2 Starting weight: 91 LBS Starting date: 05/26/2022 Today's weight: 275 LBS Today's date: 06/09/2022 Total lbs lost to date: 1 LB Total lbs lost since last in-office visit: 1 LB  Interim History: sticking to meal plan in the mornings.   she added veggies to eggs some days.  She is eating a [place] sandwich on low calorie bread, veggies, mustard at lunch and an apple.  She has [place] yogurt and fruit for p.m. snack, chicken and broccoli for dinner.  Drinking regular sodas, cutting back.    Subjective:   1. Vitamin D deficiency New diagnosis.  Discussed labs with patient today. Vitamin D level at 11.7 on 05/26/2022.  Patient complains of fatigue.  Discussed potential health issues from low vitamin D levels.  2. Other hyperlipidemia Discussed labs with patient today. Total cholesterol 237, LDL 159.  Positive family history of hyperlipidemia.  She took cholesterol medication in the past.  She is scared to take statins. The 10-year ASCVD risk score (Arnett DK, et al., 2019) is: 2.1%   Values used to calculate the score:     Age: 41 years     Sex: Female     Is Non-Hispanic African American: Yes     Diabetic: Yes     Tobacco smoker: No     Systolic Blood Pressure: 195 mmHg     Is BP treated: Yes     HDL Cholesterol: 62 mg/dL     Total Cholesterol: 237 mg/dL   3. Pre-diabetes Discussed labs with patient today. A1c 6.2 unchanged.  Patient took metformin for 1 year but stopped due to sweating.  Fasting insulin 32.5, started to reduce carbs and sweets.  4. Hypokalemia New diagnosis.  Discussed labs with patient today. Potassium 3.4 on labs, likely due to  chlorthalidone prescription.  5. Essential hypertension Diastolic blood pressure remains >90.  She is on on chlorthalidone 25 mg daily and nifedipine 30 mg daily.  She denies chest pain, but has findings of LVH on screening EKG.  Cardiac referral placed last visit.  Assessment/Plan:   1. Vitamin D deficiency Recheck in 3 to 4 months.  Begin- Vitamin D, Ergocalciferol, (DRISDOL) 1.25 MG (50000 UNIT) CAPS capsule; Take 1 capsule (50,000 Units total) by mouth every 7 (seven) days.  Dispense: 5 capsule; Refill: 0  2. Other hyperlipidemia Reviewed risk (low).  Plan review at upcoming cardiology visit.  3. Pre-diabetes Restart- metFORMIN (GLUCOPHAGE) 500 MG tablet; Take 1 tablet (500 mg total) by mouth daily with breakfast.  Dispense: 30 tablet; Refill: 0.  Continue to work on reducing intake of sugar and increasing walking time.  4. Hypokalemia Begin serving of potassium rich foods daily, list reviewed with patient.  5. Essential hypertension Keep upcoming visit with cardiology, 07/26/2022 with Dr. Domenic Polite.  Look for blood pressure improvements with dietary change and weight loss.  6. Obesity,current BMI 48.8 Okay to swap to bread slices for 1/2 baked or a sweet potato. ( Needs more potassium rich foods).  Okay to have half a banana in place of the fruit serving for increased potassium.  Nyesha is currently in the action stage of change. As such, her goal is to  continue with weight loss efforts. She has agreed to the Category 3 Plan.   Exercise goals: All adults should avoid inactivity. Some physical activity is better than none, and adults who participate in any amount of physical activity gain some health benefits.  Behavioral modification strategies: increasing lean protein intake, increasing vegetables, increasing water intake, decreasing liquid calories, decreasing eating out, no skipping meals, meal planning and cooking strategies, keeping healthy foods in the home, and holiday  eating strategies .  Gina Walker has agreed to follow-up with our clinic in 3-4 weeks. She was informed of the importance of frequent follow-up visits to maximize her success with intensive lifestyle modifications for her multiple health conditions.   Objective:   Blood pressure (!) 114/97, pulse (!) 104, temperature 98.4 F (36.9 C), height '5\' 3"'$  (1.6 m), weight 275 lb (124.7 kg), SpO2 99 %. Body mass index is 48.71 kg/m.  General: Cooperative, alert, well developed, in no acute distress. HEENT: Conjunctivae and lids unremarkable. Cardiovascular: Regular rhythm.  Lungs: Normal work of breathing. Neurologic: No focal deficits.   Lab Results  Component Value Date   CREATININE 1.11 (H) 05/26/2022   BUN 11 05/26/2022   NA 140 05/26/2022   K 3.4 (L) 05/26/2022   CL 99 05/26/2022   CO2 27 05/26/2022   Lab Results  Component Value Date   ALT 23 05/26/2022   AST 21 05/26/2022   ALKPHOS 79 05/26/2022   BILITOT 0.2 05/26/2022   Lab Results  Component Value Date   HGBA1C 6.2 (H) 05/26/2022   HGBA1C 6.2 (H) 10/14/2019   Lab Results  Component Value Date   INSULIN 32.5 (H) 05/26/2022   Lab Results  Component Value Date   TSH 1.490 05/26/2022   Lab Results  Component Value Date   CHOL 237 (H) 05/26/2022   HDL 62 05/26/2022   LDLCALC 159 (H) 05/26/2022   TRIG 90 05/26/2022   CHOLHDL 3.8 05/26/2022   Lab Results  Component Value Date   VD25OH 11.7 (L) 05/26/2022   Lab Results  Component Value Date   WBC 7.3 05/26/2022   HGB 13.7 05/26/2022   HCT 40.8 05/26/2022   MCV 84 05/26/2022   PLT 271 05/26/2022   No results found for: "IRON", "TIBC", "FERRITIN"  Attestation Statements:   Reviewed by clinician on day of visit: allergies, medications, problem list, medical history, surgical history, family history, social history, and previous encounter notes.  I, Davy Pique, am acting as Location manager for Loyal Gambler, DO.  I have reviewed the above documentation for  accuracy and completeness, and I agree with the above. Dell Ponto, DO

## 2022-06-22 ENCOUNTER — Encounter (HOSPITAL_COMMUNITY)
Admission: RE | Disposition: A | Discharge status: Home / Self Care | Payer: Self-pay | Source: Home / Self Care | Attending: Obstetrics and Gynecology

## 2022-06-22 ENCOUNTER — Ambulatory Visit (HOSPITAL_BASED_OUTPATIENT_CLINIC_OR_DEPARTMENT_OTHER): Payer: 59 | Admitting: Physician Assistant

## 2022-06-22 ENCOUNTER — Ambulatory Visit (HOSPITAL_COMMUNITY)
Admission: RE | Admit: 2022-06-22 | Discharge: 2022-06-22 | Disposition: A | Discharge status: Home / Self Care | Payer: 59 | Attending: Obstetrics and Gynecology | Admitting: Obstetrics and Gynecology

## 2022-06-22 ENCOUNTER — Ambulatory Visit (HOSPITAL_COMMUNITY): Payer: 59 | Admitting: Physician Assistant

## 2022-06-22 ENCOUNTER — Encounter (HOSPITAL_COMMUNITY): Payer: Self-pay | Admitting: Obstetrics and Gynecology

## 2022-06-22 ENCOUNTER — Other Ambulatory Visit: Payer: Self-pay

## 2022-06-22 DIAGNOSIS — I10 Essential (primary) hypertension: Secondary | ICD-10-CM

## 2022-06-22 DIAGNOSIS — Z7984 Long term (current) use of oral hypoglycemic drugs: Secondary | ICD-10-CM

## 2022-06-22 DIAGNOSIS — Z6841 Body Mass Index (BMI) 40.0 and over, adult: Secondary | ICD-10-CM | POA: Insufficient documentation

## 2022-06-22 DIAGNOSIS — J449 Chronic obstructive pulmonary disease, unspecified: Secondary | ICD-10-CM | POA: Diagnosis not present

## 2022-06-22 DIAGNOSIS — N840 Polyp of corpus uteri: Secondary | ICD-10-CM | POA: Diagnosis not present

## 2022-06-22 DIAGNOSIS — K219 Gastro-esophageal reflux disease without esophagitis: Secondary | ICD-10-CM | POA: Diagnosis not present

## 2022-06-22 DIAGNOSIS — D250 Submucous leiomyoma of uterus: Secondary | ICD-10-CM | POA: Insufficient documentation

## 2022-06-22 DIAGNOSIS — G4730 Sleep apnea, unspecified: Secondary | ICD-10-CM | POA: Insufficient documentation

## 2022-06-22 DIAGNOSIS — Z01818 Encounter for other preprocedural examination: Secondary | ICD-10-CM

## 2022-06-22 DIAGNOSIS — N950 Postmenopausal bleeding: Secondary | ICD-10-CM | POA: Insufficient documentation

## 2022-06-22 DIAGNOSIS — N841 Polyp of cervix uteri: Secondary | ICD-10-CM | POA: Insufficient documentation

## 2022-06-22 DIAGNOSIS — E119 Type 2 diabetes mellitus without complications: Secondary | ICD-10-CM | POA: Insufficient documentation

## 2022-06-22 DIAGNOSIS — D259 Leiomyoma of uterus, unspecified: Secondary | ICD-10-CM

## 2022-06-22 DIAGNOSIS — R9389 Abnormal findings on diagnostic imaging of other specified body structures: Secondary | ICD-10-CM

## 2022-06-22 DIAGNOSIS — N939 Abnormal uterine and vaginal bleeding, unspecified: Secondary | ICD-10-CM

## 2022-06-22 HISTORY — PX: HYSTEROSCOPY WITH D & C: SHX1775

## 2022-06-22 HISTORY — DX: Gastro-esophageal reflux disease without esophagitis: K21.9

## 2022-06-22 HISTORY — DX: Pneumonia, unspecified organism: J18.9

## 2022-06-22 HISTORY — DX: Headache, unspecified: R51.9

## 2022-06-22 LAB — CBC
HCT: 40 % (ref 36.0–46.0)
Hemoglobin: 13.3 g/dL (ref 12.0–15.0)
MCH: 28.9 pg (ref 26.0–34.0)
MCHC: 33.3 g/dL (ref 30.0–36.0)
MCV: 87 fL (ref 80.0–100.0)
Platelets: 280 10*3/uL (ref 150–400)
RBC: 4.6 MIL/uL (ref 3.87–5.11)
RDW: 14.1 % (ref 11.5–15.5)
WBC: 8.5 10*3/uL (ref 4.0–10.5)
nRBC: 0 % (ref 0.0–0.2)

## 2022-06-22 LAB — GLUCOSE, CAPILLARY
Glucose-Capillary: 113 mg/dL — ABNORMAL HIGH (ref 70–99)
Glucose-Capillary: 99 mg/dL (ref 70–99)

## 2022-06-22 LAB — TYPE AND SCREEN
ABO/RH(D): O POS
Antibody Screen: NEGATIVE

## 2022-06-22 LAB — ABO/RH: ABO/RH(D): O POS

## 2022-06-22 LAB — POCT PREGNANCY, URINE: Preg Test, Ur: NEGATIVE

## 2022-06-22 SURGERY — DILATATION AND CURETTAGE /HYSTEROSCOPY
Anesthesia: General | Site: Vagina

## 2022-06-22 MED ORDER — LIDOCAINE 2% (20 MG/ML) 5 ML SYRINGE
INTRAMUSCULAR | Status: DC | PRN
  Administered 2022-06-22: 100 mg via INTRAVENOUS

## 2022-06-22 MED ORDER — LIDOCAINE HCL (PF) 2 % IJ SOLN
INTRAMUSCULAR | Status: AC
  Filled 2022-06-22: qty 5

## 2022-06-22 MED ORDER — DEXAMETHASONE SODIUM PHOSPHATE 10 MG/ML IJ SOLN
INTRAMUSCULAR | Status: AC
  Filled 2022-06-22: qty 1

## 2022-06-22 MED ORDER — ORAL CARE MOUTH RINSE: 15.0000 mL | Freq: Once | OROMUCOSAL | Status: AC

## 2022-06-22 MED ORDER — MIDAZOLAM HCL 2 MG/2ML IJ SOLN
INTRAMUSCULAR | Status: AC
  Filled 2022-06-22: qty 2

## 2022-06-22 MED ORDER — ONDANSETRON HCL 4 MG/2ML IJ SOLN
INTRAMUSCULAR | Status: AC
  Filled 2022-06-22: qty 2

## 2022-06-22 MED ORDER — FENTANYL CITRATE PF 50 MCG/ML IJ SOSY
PREFILLED_SYRINGE | INTRAMUSCULAR | Status: AC
  Filled 2022-06-22: qty 1

## 2022-06-22 MED ORDER — SILVER NITRATE-POT NITRATE 75-25 % EX MISC
CUTANEOUS | Status: AC
  Filled 2022-06-22: qty 10

## 2022-06-22 MED ORDER — PROPOFOL 10 MG/ML IV BOLUS
INTRAVENOUS | Status: AC
  Filled 2022-06-22: qty 20

## 2022-06-22 MED ORDER — PROPOFOL 10 MG/ML IV BOLUS
INTRAVENOUS | Status: DC | PRN
  Administered 2022-06-22: 40 mg via INTRAVENOUS
  Administered 2022-06-22: 160 mg via INTRAVENOUS
  Administered 2022-06-22 (×3): 50 mg via INTRAVENOUS

## 2022-06-22 MED ORDER — ACETAMINOPHEN 500 MG PO TABS
1000.0000 mg | ORAL_TABLET | Freq: Once | ORAL | Status: AC
  Administered 2022-06-22: 1000 mg via ORAL
  Filled 2022-06-22: qty 2

## 2022-06-22 MED ORDER — FENTANYL CITRATE (PF) 250 MCG/5ML IJ SOLN
INTRAMUSCULAR | Status: DC | PRN
  Administered 2022-06-22 (×4): 50 ug via INTRAVENOUS

## 2022-06-22 MED ORDER — MIDAZOLAM HCL 5 MG/5ML IJ SOLN
INTRAMUSCULAR | Status: DC | PRN
  Administered 2022-06-22: 2 mg via INTRAVENOUS

## 2022-06-22 MED ORDER — CHLORHEXIDINE GLUCONATE 0.12 % MT SOLN
15.0000 mL | Freq: Once | OROMUCOSAL | Status: AC
  Administered 2022-06-22: 15 mL via OROMUCOSAL

## 2022-06-22 MED ORDER — AMISULPRIDE (ANTIEMETIC) 5 MG/2ML IV SOLN: 10.0000 mg | Freq: Once | INTRAVENOUS | Status: DC | PRN

## 2022-06-22 MED ORDER — FENTANYL CITRATE PF 50 MCG/ML IJ SOSY
25.0000 ug | PREFILLED_SYRINGE | INTRAMUSCULAR | Status: DC | PRN
  Administered 2022-06-22: 50 ug via INTRAVENOUS

## 2022-06-22 MED ORDER — CHLOROPROCAINE HCL 1 % IJ SOLN
30.0000 mL | Freq: Once | INTRAMUSCULAR | Status: AC
  Administered 2022-06-22: 10 mL
  Filled 2022-06-22: qty 30

## 2022-06-22 MED ORDER — LACTATED RINGERS IV SOLN: INTRAVENOUS | Status: DC

## 2022-06-22 MED ORDER — ONDANSETRON HCL 4 MG/2ML IJ SOLN
INTRAMUSCULAR | Status: DC | PRN
  Administered 2022-06-22: 4 mg via INTRAVENOUS

## 2022-06-22 MED ORDER — DEXAMETHASONE SODIUM PHOSPHATE 10 MG/ML IJ SOLN
INTRAMUSCULAR | Status: DC | PRN
  Administered 2022-06-22: 10 mg via INTRAVENOUS

## 2022-06-22 MED ORDER — FENTANYL CITRATE (PF) 250 MCG/5ML IJ SOLN
INTRAMUSCULAR | Status: AC
  Filled 2022-06-22: qty 5

## 2022-06-22 MED ORDER — POVIDONE-IODINE 10 % EX SWAB
2.0000 | Freq: Once | CUTANEOUS | Status: AC
  Administered 2022-06-22: 2 via TOPICAL

## 2022-06-22 MED ORDER — SODIUM CHLORIDE 0.9 % IR SOLN
Status: DC | PRN
  Administered 2022-06-22: 3000 mL

## 2022-06-22 MED ORDER — PROMETHAZINE HCL 25 MG/ML IJ SOLN: 6.2500 mg | INTRAMUSCULAR | Status: DC | PRN

## 2022-06-22 MED ORDER — SCOPOLAMINE 1 MG/3DAYS TD PT72
1.0000 | MEDICATED_PATCH | TRANSDERMAL | Status: DC
  Administered 2022-06-22: 1.5 mg via TRANSDERMAL
  Filled 2022-06-22: qty 1

## 2022-06-22 SURGICAL SUPPLY — 15 items
BAG COUNTER SPONGE SURGICOUNT (BAG) IMPLANT
CATH ROBINSON RED A/P 16FR (CATHETERS) ×1 IMPLANT
CLOTH BEACON ORANGE TIMEOUT ST (SAFETY) ×1 IMPLANT
COVER SURGICAL LIGHT HANDLE (MISCELLANEOUS) ×1 IMPLANT
DILATOR CANAL MILEX (MISCELLANEOUS) IMPLANT
GLOVE SURG SS PI 6.5 STRL IVOR (GLOVE) ×2 IMPLANT
GOWN STRL REUS W/ TWL LRG LVL3 (GOWN DISPOSABLE) ×1 IMPLANT
GOWN STRL REUS W/TWL LRG LVL3 (GOWN DISPOSABLE) ×1
IV NS IRRIG 3000ML ARTHROMATIC (IV SOLUTION) ×1 IMPLANT
KIT PROCEDURE FLUENT (KITS) ×1 IMPLANT
MANIFOLD NEPTUNE II (INSTRUMENTS) ×1 IMPLANT
PACK VAGINAL MINOR WOMEN LF (CUSTOM PROCEDURE TRAY) ×1 IMPLANT
PAD OB MATERNITY 4.3X12.25 (PERSONAL CARE ITEMS) ×1 IMPLANT
PAD PREP 24X48 CUFFED NSTRL (MISCELLANEOUS) ×1 IMPLANT
TOWEL OR 17X26 10 PK STRL BLUE (TOWEL DISPOSABLE) ×1 IMPLANT

## 2022-06-22 NOTE — Discharge Instructions (Signed)
POST-OPERATIVE INSTRUCTIONS TO PATIENT  Call [facility] at [phone]  for excessive pain, bleeding or temperature greater than or equal to 100.4 degrees (orally).    No driving for 24 hours No sexual activity for 1 week  Pain management: may use over the counter Ibuprofen or Tylenol as needed       Diet: normal  Bathing: may shower day after surgery  Return to Dr. Cletis Media on as scheduled  Delsa Bern MD

## 2022-06-22 NOTE — Interval H&P Note (Signed)
History and Physical Interval Note:  06/22/2022 12:04 PM  Gina Walker  has presented today for surgery, with the diagnosis of POST MENOPAUSAL  BLEEDING WITH THICKENED ENDOMETRIUM.  The various methods of treatment have been discussed with the patient and family. After consideration of risks, benefits and other options for treatment, the patient has consented to  Procedure(s): DILATATION AND CURETTAGE /HYSTEROSCOPY (N/A) as a surgical intervention.  [name] history has been reviewed, patient examined, no change in status, stable for surgery.  I have reviewed the patient's chart and labs.  Questions were answered to the patient's satisfaction.     Katharine Look A Keithen Capo

## 2022-06-22 NOTE — Anesthesia Postprocedure Evaluation (Signed)
Anesthesia Post Note  Patient: Gina Walker  Procedure(s) Performed: DILATATION AND CURETTAGE /HYSTEROSCOPY POLYPECTOMY (Vagina )     Patient location during evaluation: PACU Anesthesia Type: General Level of consciousness: sedated Pain management: pain level controlled Vital Signs Assessment: post-procedure vital signs reviewed and stable Respiratory status: spontaneous breathing and respiratory function stable Cardiovascular status: stable Postop Assessment: no apparent nausea or vomiting Anesthetic complications: no   No notable events documented.  Last Vitals:  Vitals:   06/22/22 1415 06/22/22 1430  BP: (!) 145/91 (!) 155/91  Pulse: 80 83  Resp: 19   Temp:    SpO2: 93% 95%    Last Pain:  Vitals:   06/22/22 1430  TempSrc:   PainSc: 3                  Shalisa Mcquade DANIEL

## 2022-06-22 NOTE — Op Note (Signed)
Preop diagnosis: post menopausal bleeding, uterine fibroids  Postop diagnosis: same and endometrial polyps  Anesthesia: IV sedation with LMA  Anesthesiologist: Dr. Tobias Alexander  Procedure: Hysteroscopy, resection of endometrial polyps and curettage  Surgeon: Dr. Katharine Look Vash Quezada  Procedure: After being informed of the planned procedure with possible complications including bleeding, infection and uterine perforation, informed consent was obtained and patient was taken to or #11  She was given sedation anesthesia without complication. She was placed in a dorsal decubitus position, prepped and draped in the sterile fashion and a red rubber  catheter was used to empty her bladder. Pelvic exam reveals anteverted uterus of normal size with no adnexal mass although her exam is limited by body habitus. .  A weighted speculum is inserted in the vagina. The cervix was grasped with a tenaculum forcep placed on the anterior lip.We proceed with a paracervical block using 1% Nesacaine, 10 cc. Uterus is sounded at 8. The cervix is then easily dilated using Hegar dilator until # 19. This allows for easy placement of a operative  hysteroscope. With perfusion of NS at a maximum pressure of 90 mmHg, we are able to evaluate the entire uterine cavity.  Observation: 2 submucosal fibroids measuring 1 cm each on anterior mid wall and posterior LUS, 2 endometrial polyps: 0.5 cm left fundal and 2 mm left cornua. Both tubal ostia were seen.  We proceed with the removal  of the 2 polyps using a hysteroscopic forceps. We then removed our instrumentation. Using a sharp curette, we proceed with curettage of the endometrial cavity which returns a small amount of normal-appearing endometrium.  Instruments are then removed. Instrument and sponge count is complete x2. Estimated blood loss is minimal. Water deficit is 155 cc of NS.  The procedure is very well tolerated by the patient who is taken to recovery room in a well and stable  condition.  Specimen: Endometrial polyps and endometrial curettings sent to pathology.

## 2022-06-22 NOTE — Anesthesia Procedure Notes (Signed)
Procedure Name: LMA Insertion Date/Time: 06/22/2022 12:21 PM  Performed by: Jenne Campus, CRNAPre-anesthesia Checklist: Patient identified, Emergency Drugs available, Suction available and Patient being monitored Patient Re-evaluated:Patient Re-evaluated prior to induction Oxygen Delivery Method: Circle System Utilized Preoxygenation: Pre-oxygenation with 100% oxygen Induction Type: IV induction Ventilation: Mask ventilation without difficulty LMA: LMA inserted and LMA with gastric port inserted LMA Size: 4.0 Number of attempts: 1 Airway Equipment and Method: Bite block Placement Confirmation: positive ETCO2 and breath sounds checked- equal and bilateral Tube secured with: Tape Dental Injury: Teeth and Oropharynx as per pre-operative assessment

## 2022-06-22 NOTE — Anesthesia Preprocedure Evaluation (Addendum)
Anesthesia Evaluation  Patient identified by MRN, date of birth, ID band Patient awake    Reviewed: Allergy & Precautions, NPO status , Patient's Chart, lab work & pertinent test results  History of Anesthesia Complications Negative for: history of anesthetic complications  Airway Mallampati: III  TM Distance: >3 FB Neck ROM: Full    Dental no notable dental hx. (+) Dental Advisory Given, Teeth Intact   Pulmonary asthma , sleep apnea , COPD   Pulmonary exam normal        Cardiovascular hypertension, Pt. on medications Normal cardiovascular exam     Neuro/Psych negative neurological ROS     GI/Hepatic Neg liver ROS,GERD  ,,  Endo/Other  diabetes  Morbid obesity  Renal/GU negative Renal ROS     Musculoskeletal negative musculoskeletal ROS (+)    Abdominal   Peds  Hematology negative hematology ROS (+)   Anesthesia Other Findings   Reproductive/Obstetrics                              Anesthesia Physical Anesthesia Plan  ASA: 3  Anesthesia Plan: General   Post-op Pain Management: Tylenol PO (pre-op)*   Induction: Intravenous  PONV Risk Score and Plan: 4 or greater and Ondansetron, Dexamethasone, Midazolam and Scopolamine patch - Pre-op  Airway Management Planned: LMA  Additional Equipment:   Intra-op Plan:   Post-operative Plan: Extubation in OR  Informed Consent: I have reviewed the patients History and Physical, chart, labs and discussed the procedure including the risks, benefits and alternatives for the proposed anesthesia with the patient or authorized representative who has indicated his/her understanding and acceptance.     Dental advisory given  Plan Discussed with: Anesthesiologist and CRNA  Anesthesia Plan Comments:        Anesthesia Quick Evaluation

## 2022-06-22 NOTE — Transfer of Care (Signed)
Immediate Anesthesia Transfer of Care Note  Patient: Gina Walker  Procedure(s) Performed: DILATATION AND CURETTAGE /HYSTEROSCOPY POLYPECTOMY (Vagina )  Patient Location: PACU  Anesthesia Type:General  Level of Consciousness: oriented, drowsy, and patient cooperative  Airway & Oxygen Therapy: Patient Spontanous Breathing and Patient connected to face mask oxygen  Post-op Assessment: Report given to RN and Post -op Vital signs reviewed and stable  Post vital signs: Reviewed  Last Vitals:  Vitals Value Taken Time  BP 161/97 06/22/22 1315  Temp    Pulse 85 06/22/22 1318  Resp 15 06/22/22 1318  SpO2 100 % 06/22/22 1318  Vitals shown include unvalidated device data.  Last Pain:  Vitals:   06/22/22 1027  TempSrc: Oral         Complications: No notable events documented.

## 2022-06-23 ENCOUNTER — Encounter (HOSPITAL_COMMUNITY): Payer: Self-pay | Admitting: Obstetrics and Gynecology

## 2022-06-23 LAB — SURGICAL PATHOLOGY

## 2022-07-14 ENCOUNTER — Ambulatory Visit (INDEPENDENT_AMBULATORY_CARE_PROVIDER_SITE_OTHER): Payer: 59 | Admitting: Family Medicine

## 2022-07-14 ENCOUNTER — Encounter (INDEPENDENT_AMBULATORY_CARE_PROVIDER_SITE_OTHER): Payer: Self-pay | Admitting: Family Medicine

## 2022-07-14 VITALS — BP 123/91 | HR 90 | Temp 98.0°F | Ht 63.0 in | Wt 275.0 lb

## 2022-07-14 DIAGNOSIS — I10 Essential (primary) hypertension: Secondary | ICD-10-CM | POA: Diagnosis not present

## 2022-07-14 DIAGNOSIS — R7303 Prediabetes: Secondary | ICD-10-CM | POA: Diagnosis not present

## 2022-07-14 DIAGNOSIS — E559 Vitamin D deficiency, unspecified: Secondary | ICD-10-CM

## 2022-07-14 DIAGNOSIS — Z6841 Body Mass Index (BMI) 40.0 and over, adult: Secondary | ICD-10-CM

## 2022-07-14 DIAGNOSIS — F5104 Psychophysiologic insomnia: Secondary | ICD-10-CM | POA: Diagnosis not present

## 2022-07-14 DIAGNOSIS — E669 Obesity, unspecified: Secondary | ICD-10-CM

## 2022-07-25 ENCOUNTER — Encounter: Payer: Self-pay | Admitting: Cardiology

## 2022-07-25 NOTE — Progress Notes (Unsigned)
Cardiology Office Note  Date: 07/26/2022   ID: Gina Walker, DOB April 26, 1981, MRN [id number]  PCP:  Celene Squibb, MD  Cardiologist:  Rozann Lesches, MD Electrophysiologist:  None   Chief Complaint  Patient presents with   Referred with LVH by ECG    History of Present Illness: Gina Walker is a 42 y.o. female referred for cardiology consultation by Dr. Valetta Close due to findings of LVH on screening ECG.  I reviewed the available records.  Chart review finds prior cardiology follow-up through [facility], saw Dr. Truett Mainland back in February 2019, I reviewed the note.  History includes reported mild, dynamic LVOT obstruction with resting LVOT gradient 12 mmHg increasing to 41 mmHg with Valsalva by prior assessment.  I am not able to pull her echocardiogram report from 2018.  Notes do not necessarily indicate diagnosis of HOCM.  She reports NYHA class II dyspnea, no palpitations or sudden syncope, no exertional chest pain.  She has been working on weight loss through diet, has lost about 20 pounds overall.  I went over her medications which are noted below.  She states that she had been off of her antihypertensives for a while, but is back on these now.  Also starting on Lipitor per PCP, but has not picked up the prescription yet.  Her most recent lab work shows LDL 159.  No reported history of sudden cardiac death in the family.  She indicates that her mother had a heart murmur and ultimately developed heart failure, but died with pneumonia.  She does indicate leg swelling, dependent, worse at the end of the day.  She has a fairly sedentary job, sits most of the day by her report.  Past Medical History:  Diagnosis Date   Allergic rhinitis    Anxiety    Asthma    Back pain    COPD (chronic obstructive pulmonary disease) (HCC)    Depression    Fibroids    GERD (gastroesophageal reflux disease)    Headache    HTN (hypertension)    Hyperlipidemia    Multiple food allergies     Obesity    Pneumonia    Sleep apnea    Type 2 diabetes mellitus ([place])    Vitamin D deficiency     Past Surgical History:  Procedure Laterality Date   GUM SURGERY     HYSTEROSCOPY WITH D & C N/A 06/22/2022   Procedure: DILATATION AND CURETTAGE /HYSTEROSCOPY POLYPECTOMY;  Surgeon: Delsa Bern, MD;  Location: [facility];  Service: Gynecology;  Laterality: N/A;   TONSILLECTOMY     WISDOM TOOTH EXTRACTION      Current Outpatient Medications  Medication Sig Dispense Refill   acetaminophen (TYLENOL) 650 MG CR tablet Take 1,300 mg by mouth every 8 (eight) hours as needed for pain.     albuterol (PROAIR HFA) 108 (90 Base) MCG/ACT inhaler Inhale 2 puffs into the lungs 4 (four) times daily as needed for wheezing or shortness of breath. 18 g 5   albuterol (PROVENTIL) (2.5 MG/3ML) 0.083% nebulizer solution Take 6 mLs (5 mg total) by nebulization every 6 (six) hours as needed for wheezing or shortness of breath. 75 mL 5   atorvastatin (LIPITOR) 20 MG tablet Take 20 mg by mouth daily.     budesonide-formoterol (SYMBICORT) 160-4.5 MCG/ACT inhaler Inhale 2 puffs into the lungs 2 (two) times daily. 3 Inhaler 5   cetirizine (ZYRTEC) 10 MG tablet Take 10 mg by mouth daily.     chlorthalidone (  HYGROTON) 25 MG tablet Take 25 mg by mouth daily.     FASENRA PEN 30 MG/ML SOAJ Inject 30 mg into the skin every 8 (eight) weeks.     fluticasone (FLONASE) 50 MCG/ACT nasal spray Place 1 spray into both nostrils 2 (two) times daily. (Patient taking differently: Place 1 spray into both nostrils daily as needed for allergies.) 16 g 2   metFORMIN (GLUCOPHAGE) 500 MG tablet Take 1 tablet (500 mg total) by mouth daily with breakfast. 30 tablet 0   montelukast (SINGULAIR) 10 MG tablet Take 10 mg by mouth at bedtime.     NIFEdipine (PROCARDIA XL/NIFEDICAL-XL) 90 MG 24 hr tablet Take 90 mg by mouth daily.     SPIRIVA RESPIMAT 1.25 MCG/ACT AERS Inhale 2 each into the lungs daily.     No current facility-administered  medications for this visit.   Allergies:  Cefuroxime axetil, Shellfish allergy, and Omnicef [cefdinir]   Social History: The patient  reports that she has never smoked. She has never used smokeless tobacco. She reports current alcohol use. She reports that she does not use drugs.   Family History: [name] family history includes Cancer in her father, maternal aunt, and mother; Clotting disorder in her maternal aunt; Colon cancer in her maternal grandmother; Diabetes in her mother; Heart disease in her maternal grandmother, mother, and paternal grandfather; Hyperlipidemia in her father; Hypertension in her father and mother; Obesity in her mother; Sleep apnea in her father and mother.   ROS: No orthopnea or PND.  Physical Exam: VS:  BP 132/80   Pulse 96   Ht '5\' 3"'$  (1.6 m)   Wt 274 lb 12.8 oz (124.6 kg)   SpO2 97%   BMI 48.68 kg/m , BMI Body mass index is 48.68 kg/m.  Wt Readings from Last 3 Encounters:  07/26/22 274 lb 12.8 oz (124.6 kg)  07/14/22 275 lb (124.7 kg)  06/22/22 280 lb (127 kg)    General: Patient appears comfortable at rest. HEENT: Conjunctiva and lids normal. Neck: Supple, no elevated JVP or carotid bruits. Lungs: Clear to auscultation, nonlabored breathing at rest. Cardiac: Regular rate and rhythm, no S3, 2/6 basal systolic murmur, no pericardial rub. Abdomen: Soft, nontender, bowel sounds present. Extremities: No pitting edema, distal pulses 2+. Skin: Warm and dry. Musculoskeletal: No kyphosis. Neuropsychiatric: Alert and oriented x3, affect grossly appropriate.  ECG:  An ECG dated 05/26/2022 was personally reviewed today and demonstrated:  Sinus rhythm with LVH and repolarization changes.  Recent Labwork: 05/26/2022: ALT 23; AST 21; BUN 11; Creatinine, Ser 1.11; Potassium 3.4; Sodium 140; TSH 1.490 06/22/2022: Hemoglobin 13.3; Platelets 280     Component Value Date/Time   CHOL 237 (H) 05/26/2022 0924   TRIG 90 05/26/2022 0924   HDL 62 05/26/2022  0924   CHOLHDL 3.8 05/26/2022 0924   LDLCALC 159 (H) 05/26/2022 0924   Other Studies Reviewed Today:  No prior cardiac testing available for review today.  Assessment and Plan:  1.  Left ventricular hypertrophy by screening ECG in the setting of fairly longstanding hypertension.  Could be hypertensive heart disease, but it looks like she also had prior cardiology follow-up with [facility] for a resting LVOT gradient, although I do not see any clear diagnosis of HOCM.  No old echocardiograms for review.  At this point plan will be to update echocardiogram and proceed from there.  2.  Longstanding essential hypertension.  Reports compliance with nifedipine and chlorthalidone at this time.  3.  Mixed hyperlipidemia, LDL 159.  Prescribed Lipitor by PCP, but has not yet picked up the prescription.  We discussed this today.  Agree with treatment to get LDL under 60 with concurrent diagnosis of type 2 diabetes mellitus.  Medication Adjustments/Labs and Tests Ordered: Current medicines are reviewed at length with the patient today.  Concerns regarding medicines are outlined above.   Tests Ordered: Orders Placed This Encounter  Procedures   ECHOCARDIOGRAM COMPLETE    Medication Changes: No orders of the defined types were placed in this encounter.   Disposition:  Follow up  test results.  Signed, Satira Sark, MD, [facility] 07/26/2022 9:18 AM    [facility] at [facility] [address], [place], [place] [address] Phone: [phone]; Fax: [phone]

## 2022-07-26 ENCOUNTER — Ambulatory Visit: Payer: 59 | Attending: Cardiology | Admitting: Cardiology

## 2022-07-26 ENCOUNTER — Encounter: Payer: Self-pay | Admitting: Cardiology

## 2022-07-26 VITALS — BP 132/80 | HR 96 | Ht 63.0 in | Wt 274.8 lb

## 2022-07-26 DIAGNOSIS — E782 Mixed hyperlipidemia: Secondary | ICD-10-CM | POA: Diagnosis not present

## 2022-07-26 DIAGNOSIS — I10 Essential (primary) hypertension: Secondary | ICD-10-CM | POA: Diagnosis not present

## 2022-07-26 DIAGNOSIS — I517 Cardiomegaly: Secondary | ICD-10-CM | POA: Diagnosis not present

## 2022-07-26 DIAGNOSIS — I119 Hypertensive heart disease without heart failure: Secondary | ICD-10-CM | POA: Diagnosis not present

## 2022-07-26 NOTE — Patient Instructions (Signed)
Medication Instructions:  Your physician recommends that you continue on your current medications as directed. Please refer to the Current Medication list given to you today.   Labwork: None today  Testing/Procedures: Your physician has requested that you have an echocardiogram. Echocardiography is a painless test that uses sound waves to create images of your heart. It provides your doctor with information about the size and shape of your heart and how well your heart's chambers and valves are working. This procedure takes approximately one hour. There are no restrictions for this procedure. Please do NOT wear cologne, perfume, aftershave, or lotions (deodorant is allowed). Please arrive 15 minutes prior to your appointment time.   Follow-Up: We will call you with results  Any Other Special Instructions Will Be Listed Below (If Applicable).  If you need a refill on your cardiac medications before your next appointment, please call your pharmacy.  

## 2022-08-03 ENCOUNTER — Encounter: Payer: Self-pay | Admitting: Obstetrics and Gynecology

## 2022-08-04 NOTE — Progress Notes (Signed)
Chief Complaint:   OBESITY Gina Walker is here to discuss her progress with her obesity treatment plan along with follow-up of her obesity related diagnoses. Gina Walker is on the Category 3 Plan and states she is following her eating plan approximately 60% of the time. Gina Walker states she has not been exercising.  Today's visit was #: 3 Starting weight: 276 lbs Starting date: 05/26/22 Today's weight: 275 lbs Today's date: 07/14/22 Total lbs lost to date: 1 Total lbs lost since last in-office visit: 0  Interim History: Net progress down 1 pound in 6 weeks of medically supervised weight management.  Recovering from a D&C and polypectomy for postmenopausal bleeding.  Has not felt as well with little appetite.  Plans to walk at the gym.  Lacks motivation.  Works from home, lives alone.  Subjective:   1. Chronic insomnia Lies in bed at 1 AM with CPAP but is not falling asleep until 3-4 AM.  Averages 4-6 hours of sleep with CPAP.  Has taken meds for depression and anxiety in the past.  Failed to see progress with melatonin.  2. Pre-diabetes A1c 6.2, doing well on metformin 500 mg daily.  Has had sweating and some loose stools. Has cut out regular soda's.  3. Vitamin D deficiency Last vitamin D 11.7 on 05/26/2022. Taking prescription vitamin D 50,000 IU weekly. Energy level low.  4. Essential hypertension Diastolic blood pressure 91 today. On chlorthalidone 25 mg daily plus nifedipine XL 60 mg daily. Has follow-up with Dr. Domenic Polite on 07/26/2022 for LVH findings on EKG.  Assessment/Plan:   1. Chronic insomnia Discussed job changes to help with her sleep schedule. Consider SSRI.  Sleep hygiene handout given.  2. Pre-diabetes Continue metformin 500 mg daily with food.  Continue to reduce sugar intake.  3. Vitamin D deficiency Refilled prescription vitamin D 50,000 IU weekly, #5 cap, no refills.  4. Essential hypertension Continue prescribed diet, actively working on weight  reduction.  5. Obesity,current BMI 48.8 1.  Discussed plan to look for other jobs-unhappy with work hours causing sleep issues and lacking exercise time.  Gina Walker is currently in the action stage of change. As such, her goal is to continue with weight loss efforts. She has agreed to the Category 3 Plan.   Exercise goals: Gym 2+ times per week.  Behavioral modification strategies: increasing lean protein intake, increasing water intake, decreasing eating out, no skipping meals, meal planning and cooking strategies, keeping healthy foods in the home, and planning for success.  Gina Walker has agreed to follow-up with our clinic in 4 weeks. She was informed of the importance of frequent follow-up visits to maximize her success with intensive lifestyle modifications for her multiple health conditions.   Objective:   Blood pressure (!) 123/91, pulse 90, temperature 98 F (36.7 C), height 5' 3"$  (1.6 m), weight 275 lb (124.7 kg), SpO2 96 %. Body mass index is 48.71 kg/m.  General: Cooperative, alert, well developed, in no acute distress. HEENT: Conjunctivae and lids unremarkable. Cardiovascular: Regular rhythm.  Lungs: Normal work of breathing. Neurologic: No focal deficits.   Lab Results  Component Value Date   CREATININE 1.11 (H) 05/26/2022   BUN 11 05/26/2022   NA 140 05/26/2022   K 3.4 (L) 05/26/2022   CL 99 05/26/2022   CO2 27 05/26/2022   Lab Results  Component Value Date   ALT 23 05/26/2022   AST 21 05/26/2022   ALKPHOS 79 05/26/2022   BILITOT 0.2 05/26/2022   Lab Results  Component Value Date   HGBA1C 6.2 (H) 05/26/2022   HGBA1C 6.2 (H) 10/14/2019   Lab Results  Component Value Date   INSULIN 32.5 (H) 05/26/2022   Lab Results  Component Value Date   TSH 1.490 05/26/2022   Lab Results  Component Value Date   CHOL 237 (H) 05/26/2022   HDL 62 05/26/2022   LDLCALC 159 (H) 05/26/2022   TRIG 90 05/26/2022   CHOLHDL 3.8 05/26/2022   Lab Results  Component Value  Date   VD25OH 11.7 (L) 05/26/2022   Lab Results  Component Value Date   WBC 8.5 06/22/2022   HGB 13.3 06/22/2022   HCT 40.0 06/22/2022   MCV 87.0 06/22/2022   PLT 280 06/22/2022   No results found for: "IRON", "TIBC", "FERRITIN"   Attestation Statements:   Reviewed by clinician on day of visit: allergies, medications, problem list, medical history, surgical history, family history, social history, and previous encounter notes.  I have personally spent 30  minutes total time today in preparation, patient care, nutritional counseling and documentation for this visit, including the following: review of clinical lab tests; review of medical tests/procedures/services.    I, Georgianne Fick, FNP, am acting as transcriptionist for Dr. Loyal Gambler.  I have reviewed the above documentation for accuracy and completeness, and I agree with the above. Dell Ponto, DO

## 2022-08-12 ENCOUNTER — Ambulatory Visit (HOSPITAL_COMMUNITY)
Admission: RE | Admit: 2022-08-12 | Discharge: 2022-08-12 | Disposition: A | Discharge status: Home / Self Care | Payer: 59 | Source: Ambulatory Visit | Attending: Cardiology | Admitting: Cardiology

## 2022-08-12 DIAGNOSIS — I517 Cardiomegaly: Secondary | ICD-10-CM

## 2022-08-12 DIAGNOSIS — E785 Hyperlipidemia, unspecified: Secondary | ICD-10-CM | POA: Insufficient documentation

## 2022-08-12 DIAGNOSIS — I119 Hypertensive heart disease without heart failure: Secondary | ICD-10-CM | POA: Diagnosis not present

## 2022-08-12 DIAGNOSIS — I110 Hypertensive heart disease with heart failure: Secondary | ICD-10-CM | POA: Insufficient documentation

## 2022-08-12 DIAGNOSIS — J449 Chronic obstructive pulmonary disease, unspecified: Secondary | ICD-10-CM | POA: Insufficient documentation

## 2022-08-12 DIAGNOSIS — I3481 Nonrheumatic mitral (valve) annulus calcification: Secondary | ICD-10-CM | POA: Diagnosis not present

## 2022-08-12 DIAGNOSIS — E119 Type 2 diabetes mellitus without complications: Secondary | ICD-10-CM | POA: Diagnosis not present

## 2022-08-12 DIAGNOSIS — I509 Heart failure, unspecified: Secondary | ICD-10-CM | POA: Diagnosis not present

## 2022-08-12 LAB — ECHOCARDIOGRAM COMPLETE
AR max vel: 2.4 cm2
AV Area VTI: 2.69 cm2
AV Area mean vel: 2.51 cm2
AV Mean grad: 8.5 mmHg
AV Peak grad: 16.3 mmHg
Ao pk vel: 2.02 m/s
Area-P 1/2: 4.31 cm2
S' Lateral: 3 cm

## 2022-08-12 MED ORDER — PERFLUTREN LIPID MICROSPHERE
1.0000 mL | INTRAVENOUS | Status: AC | PRN
  Administered 2022-08-12: 3 mL via INTRAVENOUS

## 2022-08-12 NOTE — Progress Notes (Signed)
*  PRELIMINARY RESULTS* Echocardiogram 2D Echocardiogram has been performed with [facility].  Samuel Germany 08/12/2022, 11:44 AM

## 2022-08-16 ENCOUNTER — Encounter (INDEPENDENT_AMBULATORY_CARE_PROVIDER_SITE_OTHER): Payer: Self-pay | Admitting: Family Medicine

## 2022-08-16 ENCOUNTER — Telehealth (INDEPENDENT_AMBULATORY_CARE_PROVIDER_SITE_OTHER): Payer: 59 | Admitting: Family Medicine

## 2022-08-16 DIAGNOSIS — Z566 Other physical and mental strain related to work: Secondary | ICD-10-CM

## 2022-08-16 DIAGNOSIS — R197 Diarrhea, unspecified: Secondary | ICD-10-CM | POA: Diagnosis not present

## 2022-08-16 DIAGNOSIS — E559 Vitamin D deficiency, unspecified: Secondary | ICD-10-CM | POA: Diagnosis not present

## 2022-08-16 DIAGNOSIS — I10 Essential (primary) hypertension: Secondary | ICD-10-CM

## 2022-08-16 DIAGNOSIS — Z6841 Body Mass Index (BMI) 40.0 and over, adult: Secondary | ICD-10-CM

## 2022-08-16 MED ORDER — VITAMIN D (ERGOCALCIFEROL) 1.25 MG (50000 UNIT) PO CAPS: 50000.0000 [IU] | ORAL_CAPSULE | ORAL | 0 refills | Status: DC

## 2022-08-16 NOTE — Progress Notes (Signed)
TeleHealth Visit:  This visit was completed with telemedicine (audio/video) technology. Gina Walker has verbally consented to this TeleHealth visit. The patient is located at home, the provider is located at home. The participants in this visit include the listed provider and patient. The visit was conducted today via MyChart video.  Gina Walker is here to discuss her progress with her Gina treatment plan along with follow-up of her Gina related diagnoses.   Today's visit was # 4 Starting weight: 276 lbs Starting date: 05/26/22 Weight at last in office visit: 275 lbs on 07/14/22 Total weight loss: 1 lbs at last in office visit on 07/14/22. Today's reported weight: No weight reported.  Nutrition Plan: the Category 3 plan.- 60% adherence  Current exercise:  none  Interim History:  Has not been able to follow plan because of GI symptoms-nausea, diarrhea, abdominal pain. She is trying to prioritize protein intake. Has a lot of stress at work and feels this could causing her stomach issues. Has been having broth, crackers, occasional sodas.  Assessment/Plan:  1. Hypertension Hypertension poorly controlled.  On Procardia 90 mg daily and chlorthalidone 50 mg daily.  Both of these medication dosages were recently increased by her gynecologist.  She is wondering if this could be causing her GI issues. BP Readings from Last 3 Encounters:  07/26/22 132/80  07/14/22 (!) 123/91  06/22/22 (!) 155/91   Lab Results  Component Value Date   CREATININE 1.11 (H) 05/26/2022   CREATININE 1.12 (H) 10/15/2019   CREATININE 0.98 10/14/2019   No results found for: "GFR"  Plan: Continue all antihypertensives at current dosages. Follow-up with PCP this week.  2.  Diarrhea Has had intermittent diarrhea, nausea, abdominal pain for the last 1.5 weeks to 2 weeks.  She feels this could be caused by stress.  Also wonders if it was caused by change in dosage of her  antihypertensives.  Plan: Discussed high-protein foods which may be easier on her stomach. Follow-up with PCP this week.  3.  Stress at work Has been sleeping poorly, has elevated blood pressure, has GI issues.  All could be related to stress at work.  Has been on Lexapro in the past which she stopped taking because she did not think she needed it.  Plan: Follow-up with PCP this week to discuss.  4. Vitamin D Deficiency Vitamin D is not at goal of 50.  Most recent vitamin D level was 11.7.  She is on  prescription ergocalciferol 50,000 IU weekly. Lab Results  Component Value Date   VD25OH 11.7 (L) 05/26/2022    Plan: Refill prescription vitamin D 50,000 IU weekly.   5. Morbid Gina: Current BMI 48.7 Gina Walker is currently in the action stage of change. As such, her goal is to continue with weight loss efforts.  She has agreed to the Category 3 plan.  1.  Provided suggestions for high-protein foods. 2.  Prioritize protein.  Resume category 3 plan when she is able.  Exercise goals: No exercise has been prescribed at this time.  Behavioral modification strategies: increasing lean protein intake, decreasing simple carbohydrates , meal planning , decrease liquid calories, and planning for success.  Gina Walker has agreed to follow-up with our clinic in 5 weeks.   No orders of the defined types were placed in this encounter.   There are no discontinued medications.   Meds ordered this encounter  Medications   Vitamin D, Ergocalciferol, (DRISDOL) 1.25 MG (50000 UNIT) CAPS capsule    Sig: Take 1 capsule (50,000  Units total) by mouth every 7 (seven) days.    Dispense:  5 capsule    Refill:  0    Order Specific Question:   Supervising Provider    Answer:   Dell Ponto [[id number]]      Objective:   VITALS: Per patient if applicable, see vitals. GENERAL: Alert and in no acute distress. CARDIOPULMONARY: No increased WOB. Speaking in clear sentences.  PSYCH: Pleasant and  cooperative. Speech normal rate and rhythm. Affect is appropriate. Insight and judgement are appropriate. Attention is focused, linear, and appropriate.  NEURO: Oriented as arrived to appointment on time with no prompting.   Lab Results  Component Value Date   CREATININE 1.11 (H) 05/26/2022   BUN 11 05/26/2022   NA 140 05/26/2022   K 3.4 (L) 05/26/2022   CL 99 05/26/2022   CO2 27 05/26/2022   Lab Results  Component Value Date   ALT 23 05/26/2022   AST 21 05/26/2022   ALKPHOS 79 05/26/2022   BILITOT 0.2 05/26/2022   Lab Results  Component Value Date   HGBA1C 6.2 (H) 05/26/2022   HGBA1C 6.2 (H) 10/14/2019   Lab Results  Component Value Date   INSULIN 32.5 (H) 05/26/2022   Lab Results  Component Value Date   TSH 1.490 05/26/2022   Lab Results  Component Value Date   CHOL 237 (H) 05/26/2022   HDL 62 05/26/2022   LDLCALC 159 (H) 05/26/2022   TRIG 90 05/26/2022   CHOLHDL 3.8 05/26/2022   Lab Results  Component Value Date   WBC 8.5 06/22/2022   HGB 13.3 06/22/2022   HCT 40.0 06/22/2022   MCV 87.0 06/22/2022   PLT 280 06/22/2022   No results found for: "IRON", "TIBC", "FERRITIN" Lab Results  Component Value Date   VD25OH 11.7 (L) 05/26/2022    Attestation Statements:   Reviewed by clinician on day of visit: allergies, medications, problem list, medical history, surgical history, family history, social history, and previous encounter notes.   This was prepared with the assistance of [employer].  Occasional wrong-word or sound-a-like substitutions may have occurred due to the inherent limitations of voice recognition software.

## 2022-09-11 ENCOUNTER — Other Ambulatory Visit (INDEPENDENT_AMBULATORY_CARE_PROVIDER_SITE_OTHER): Payer: Self-pay | Admitting: Family Medicine

## 2022-09-11 DIAGNOSIS — E559 Vitamin D deficiency, unspecified: Secondary | ICD-10-CM

## 2022-09-19 ENCOUNTER — Ambulatory Visit (INDEPENDENT_AMBULATORY_CARE_PROVIDER_SITE_OTHER): Payer: 59 | Admitting: Family Medicine

## 2022-09-19 ENCOUNTER — Encounter (INDEPENDENT_AMBULATORY_CARE_PROVIDER_SITE_OTHER): Payer: Self-pay | Admitting: Family Medicine

## 2022-09-19 VITALS — BP 156/90 | HR 94 | Temp 98.4°F | Ht 63.0 in | Wt 270.0 lb

## 2022-09-19 DIAGNOSIS — Z6841 Body Mass Index (BMI) 40.0 and over, adult: Secondary | ICD-10-CM

## 2022-09-19 DIAGNOSIS — E559 Vitamin D deficiency, unspecified: Secondary | ICD-10-CM

## 2022-09-19 DIAGNOSIS — R7303 Prediabetes: Secondary | ICD-10-CM | POA: Diagnosis not present

## 2022-09-19 DIAGNOSIS — I10 Essential (primary) hypertension: Secondary | ICD-10-CM

## 2022-09-19 MED ORDER — METFORMIN HCL 500 MG PO TABS: 500.0000 mg | ORAL_TABLET | Freq: Every day | ORAL | 0 refills | Status: DC

## 2022-09-19 MED ORDER — VITAMIN D (ERGOCALCIFEROL) 1.25 MG (50000 UNIT) PO CAPS: 50000.0000 [IU] | ORAL_CAPSULE | ORAL | 0 refills | Status: DC

## 2022-09-19 NOTE — Assessment & Plan Note (Signed)
Last vitamin D Lab Results  Component Value Date   VD25OH 11.7 (L) 05/26/2022   She has been better about taking her prescription vitamin D 50,000 IU once weekly.  Energy level remains fairly low.  Recheck vitamin D level today with a target goal 50-70.

## 2022-09-19 NOTE — Progress Notes (Signed)
Office: [phone]  /  Fax: [phone]  [place]  Starting Date: 05/26/22  Starting Weight: 276lb   Weight Lost Since Last Visit: 5lb   Vitals Temp: 98.4 F (36.9 C) BP: (!) 156/90 Pulse Rate: 94 SpO2: 95 %   Body Composition  Body Fat %: 50.9 % Fat Mass (lbs): 137.8 lbs Muscle Mass (lbs): 126.2 lbs Total Body Water (lbs): 95 lbs Visceral Fat Rating : 17     HPI  Chief Complaint: OBESITY  Gina Walker is here to discuss her progress with her obesity treatment plan. She is on the the Category 3 Plan and states she is following her eating plan approximately 40 % of the time. She states she is exercising 30-45 minutes 2-3 times per week.   Interval History:  Since last office visit she is down 5 lb in the past 2 mos She is still skipping meals some days She has a hard time getting up in the morning to eat breakfast. She has a hard time making good choices when eating out but is mindful of portions She works out of her dad's house and he tends to buy foods that are higher in fat. She did cut out regular sodas.  She does drink some sweet tea.    Pharmacotherapy: metformin  PHYSICAL EXAM:  Blood pressure (!) 156/90, pulse 94, temperature 98.4 F (36.9 C), height 5\' 3"  (1.6 m), weight 270 lb (122.5 kg), SpO2 95 %. Body mass index is 47.83 kg/m.  General: She is overweight, cooperative, alert, well developed, and in no acute distress. PSYCH: Has normal mood, affect and thought process.   Lungs: Normal breathing effort, no conversational dyspnea.   ASSESSMENT AND PLAN  TREATMENT PLAN FOR OBESITY:  Recommended Dietary Goals  Gina Walker is currently in the action stage of change. As such, her goal is to continue weight management plan. She has agreed to the Category 3 Plan. Bring healthy lunches to work at [employer] Protein shake or protein smoothie (low sugar greek yogurt, fairlife milk, frozen fruit) can be consumed for breakfast to  prevent meal skipping  Behavioral Intervention  We discussed the following Behavioral Modification Strategies today: increasing lean protein intake, increasing vegetables, increasing water intake, work on meal planning and easy cooking plans, decreasing eating out or consumption of processed foods, and making healthy choices when eating convenient foods, emotional eating strategies and understanding the difference between hunger signals and cravings, work on managing stress, creating time for self-care and relaxation measures, avoiding temptations and identifying enticing environmental cues, planning for success, and keeping healthy foods at home.  Additional resources provided today: NA  Recommended Physical Activity Goals  Gina Walker has been advised to work up to 150 minutes of moderate intensity aerobic activity a week and strengthening exercises 2-3 times per week for cardiovascular health, weight loss maintenance and preservation of muscle mass.   She has agreed to Work on scheduling and tracking physical activity.   Pharmacotherapy changes for the treatment of obesity: Metformin  ASSOCIATED CONDITIONS ADDRESSED TODAY  Pre-diabetes Assessment & Plan: Lab Results  Component Value Date   HGBA1C 6.2 (H) 05/26/2022   She is doing well on metformin 500 mg once daily with food without adverse side effects.  She has actively been working on weight reduction, reduce intake of added sugar and refined carbohydrates.  We discussed a plan to ramp up regular exercise.  Plan: Increase metformin to 500 mg twice daily with food.  Orders: -  Comprehensive metabolic panel -     Hemoglobin A1c -     Vitamin B12 -     metFORMIN HCl; Take 1 tablet (500 mg total) by mouth daily with breakfast.  Dispense: 180 tablet; Refill: 0  Vitamin D deficiency Assessment & Plan: Last vitamin D Lab Results  Component Value Date   VD25OH 11.7 (L) 05/26/2022   She has been better about taking her  prescription vitamin D 50,000 IU once weekly.  Energy level remains fairly low.  Recheck vitamin D level today with a target goal 50-70.  Orders: -     Vitamin D (Ergocalciferol); Take 1 capsule (50,000 Units total) by mouth every 7 (seven) days.  Dispense: 12 capsule; Refill: 0 -     VITAMIN D 25 Hydroxy (Vit-D Deficiency, Fractures)  Morbid obesity (HCC)  BMI 45.0-49.9, adult (HCC)  Essential hypertension Assessment & Plan: BP elevated today She reports taking all of her BP medicine- chlorthalidone 25 mg daily + Nifedipine 90 mg daily. BP at home running 140s/80s.  Denies CP or HA. Reduce intake of high sodium foods and continue to work on weight reduction.     She was informed of the importance of frequent follow up visits to maximize her success with intensive lifestyle modifications for her multiple health conditions.   ATTESTASTION STATEMENTS:  Reviewed by clinician on day of visit: allergies, medications, problem list, medical history, surgical history, family history, social history, and previous encounter notes pertinent to obesity diagnosis.   I have personally spent 30 minutes total time today in preparation, patient care, nutritional counseling and documentation for this visit, including the following: review of clinical lab tests; review of medical tests/procedures/services.      Dell Ponto, DO DABFM, DABOM [facility] [address], [place] [address] [phone]

## 2022-09-19 NOTE — Assessment & Plan Note (Signed)
Lab Results  Component Value Date   HGBA1C 6.2 (H) 05/26/2022   She is doing well on metformin 500 mg once daily with food without adverse side effects.  She has actively been working on weight reduction, reduce intake of added sugar and refined carbohydrates.  We discussed a plan to ramp up regular exercise.  Plan: Increase metformin to 500 mg twice daily with food.

## 2022-09-19 NOTE — Assessment & Plan Note (Signed)
BP elevated today She reports taking all of her BP medicine- chlorthalidone 25 mg daily + Nifedipine 90 mg daily. BP at home running 140s/80s.  Denies CP or HA. Reduce intake of high sodium foods and continue to work on weight reduction.

## 2022-09-20 LAB — COMPREHENSIVE METABOLIC PANEL
ALT: 19 IU/L (ref 0–32)
AST: 24 IU/L (ref 0–40)
Albumin/Globulin Ratio: 1.3 (ref 1.2–2.2)
Albumin: 4.3 g/dL (ref 3.9–4.9)
Alkaline Phosphatase: 87 IU/L (ref 44–121)
BUN/Creatinine Ratio: 9 (ref 9–23)
BUN: 10 mg/dL (ref 6–24)
Bilirubin Total: 0.2 mg/dL (ref 0.0–1.2)
CO2: 26 mmol/L (ref 20–29)
Calcium: 9.6 mg/dL (ref 8.7–10.2)
Chloride: 98 mmol/L (ref 96–106)
Creatinine, Ser: 1.13 mg/dL — ABNORMAL HIGH (ref 0.57–1.00)
Globulin, Total: 3.3 g/dL (ref 1.5–4.5)
Glucose: 107 mg/dL — ABNORMAL HIGH (ref 70–99)
Potassium: 3 mmol/L — ABNORMAL LOW (ref 3.5–5.2)
Sodium: 141 mmol/L (ref 134–144)
Total Protein: 7.6 g/dL (ref 6.0–8.5)
eGFR: 62 mL/min/{1.73_m2} (ref >59)

## 2022-09-20 LAB — HEMOGLOBIN A1C
Est. average glucose Bld gHb Est-mCnc: 128 mg/dL
Hgb A1c MFr Bld: 6.1 % — ABNORMAL HIGH (ref 4.8–5.6)

## 2022-09-20 LAB — VITAMIN D 25 HYDROXY (VIT D DEFICIENCY, FRACTURES): Vit D, 25-Hydroxy: 27.5 ng/mL — ABNORMAL LOW (ref 30.0–100.0)

## 2022-09-20 LAB — VITAMIN B12: Vitamin B-12: 390 pg/mL (ref 232–1245)

## 2022-10-18 ENCOUNTER — Encounter (INDEPENDENT_AMBULATORY_CARE_PROVIDER_SITE_OTHER): Payer: Self-pay | Admitting: Family Medicine

## 2022-10-18 ENCOUNTER — Ambulatory Visit (INDEPENDENT_AMBULATORY_CARE_PROVIDER_SITE_OTHER): Payer: 59 | Admitting: Family Medicine

## 2022-10-18 VITALS — BP 141/83 | HR 80 | Temp 97.9°F | Ht 63.0 in | Wt 268.0 lb

## 2022-10-18 DIAGNOSIS — R7303 Prediabetes: Secondary | ICD-10-CM

## 2022-10-18 DIAGNOSIS — E559 Vitamin D deficiency, unspecified: Secondary | ICD-10-CM | POA: Diagnosis not present

## 2022-10-18 DIAGNOSIS — I10 Essential (primary) hypertension: Secondary | ICD-10-CM

## 2022-10-18 DIAGNOSIS — Z6841 Body Mass Index (BMI) 40.0 and over, adult: Secondary | ICD-10-CM

## 2022-10-18 MED ORDER — VITAMIN D (ERGOCALCIFEROL) 1.25 MG (50000 UNIT) PO CAPS: 50000.0000 [IU] | ORAL_CAPSULE | ORAL | 0 refills | Status: DC

## 2022-10-18 NOTE — Assessment & Plan Note (Signed)
Blood pressure is mildly elevated today.  She reports good compliance taking chlorthalidone 25 mg daily and nifedipine 90 mg XL daily.  She denies headache or chest pain.  Continue active plan for weight reduction, reducing intake of processed foods and restaurant meals.  Continue current medications.  Recommend monitoring home blood pressure readings once a week.

## 2022-10-18 NOTE — Assessment & Plan Note (Signed)
Lab Results  Component Value Date   HGBA1C 6.1 (H) 09/19/2022   She did increase metformin to 500 mg to twice daily.  She has had more frequent stools related to her food choices.

## 2022-10-18 NOTE — Progress Notes (Signed)
Office: [phone]  /  Fax: [phone]  WEIGHT SUMMARY AND BIOMETRICS  Starting Date: 05/26/22  Starting Weight: 276lb   Weight Lost Since Last Visit: 2lb   Vitals Temp: 97.9 F (36.6 C) BP: (!) 141/83 Pulse Rate: 80 SpO2: 100 %   Body Composition  Body Fat %: 51.3 % Fat Mass (lbs): 137.6 lbs Muscle Mass (lbs): 124 lbs Total Body Water (lbs): 95.4 lbs Visceral Fat Rating : 17   HPI  Chief Complaint: OBESITY  Gina Walker is here to discuss her progress with her obesity treatment plan. She is on the the Category 3 Plan and states she is following her eating plan approximately 60 % of the time. She states she is exercising 0 minutes 0 times per week.   Interval History:  Since last office visit she is down 2 lb She has a net weight loss of 12 lb in the past 5 mos She has been cleaning out her house and her mom's stuff from when she passed 3 years ago She is still skips breakfast some mornings.  She is having eggs,peppers and cheese some mornings.   At lunch, she is having salads with [place] dressing She typically doesn't snack She is off plan for dinner some days with eating out She did try out [employer] for breakfast She did try out [employer] for dinner Her dad has been more supportives  Pharmacotherapy: metformin 500 mg bid  PHYSICAL EXAM:  Blood pressure (!) 141/83, pulse 80, temperature 97.9 F (36.6 C), height  (1.6 m), weight 268 lb (121.6 kg), SpO2 100 %. Body mass index is 47.47 kg/m.  General: She is overweight, cooperative, alert, well developed, and in no acute distress. PSYCH: Has normal mood, affect and thought process.   Lungs: Normal breathing effort, no conversational dyspnea.   ASSESSMENT AND PLAN  TREATMENT PLAN FOR OBESITY:  Recommended Dietary Goals  Gina Walker is currently in the action stage of change. As such, her goal is to continue weight management plan. She has agreed to the Category 3 Plan.  Behavioral  Intervention  We discussed the following Behavioral Modification Strategies today: increasing lean protein intake, decreasing simple carbohydrates , increasing vegetables, increasing lower glycemic fruits, increasing fiber rich foods, increasing water intake, work on managing stress, creating time for self-care and relaxation measures, avoiding temptations and identifying enticing environmental cues, continue to practice mindfulness when eating, planning for success, and keeping healthy foods at home.  Additional resources provided today: NA  Recommended Physical Activity Goals  Gina Walker has been advised to work up to 150 minutes of moderate intensity aerobic activity a week and strengthening exercises 2-3 times per week for cardiovascular health, weight loss maintenance and preservation of muscle mass.   She has agreed to Start aerobic activity with a goal of 150 minutes a week at moderate intensity.   Pharmacotherapy changes for the treatment of obesity:   ASSOCIATED CONDITIONS ADDRESSED TODAY  Pre-diabetes Assessment & Plan: Lab Results  Component Value Date   HGBA1C 6.1 (H) 09/19/2022  Reviewed labs from last visit.  A1c has reduced from 6.2-6.1.  Her metformin was increased to 500 mg twice daily last visit. She is  having some lose stools but no other side effects. Has + fam hx of type II diabetes.  She is actively working on weight reduction, down 8 pounds in the past 5 months.  Continue working on a low sugar/low starch diet, regular exercise and weight reduction.  Continue metformin 500 mg twice  daily.     Vitamin D deficiency Assessment & Plan:  Last vitamin D Lab Results  Component Value Date   VD25OH 27.5 (L) 09/19/2022   She is on ergocalciferol 50,000 IU once weekly.  Her energy level is starting to improve.  We reviewed her labs from her last visit.  Her vitamin D level did increase from 11.7-27.5.  We discussed a target vitamin D level of 50-70 to help with fatigue,  leptin resistance and bone health.  Refilled ergocalciferol 50,000 IU once weekly.  Recheck level in 3 to 4 months.  Orders: -     Vitamin D (Ergocalciferol); Take 1 capsule (50,000 Units total) by mouth every 7 (seven) days.  Dispense: 12 capsule; Refill: 0  Primary hypertension Assessment & Plan: Blood pressure is mildly elevated today.  She reports good compliance taking chlorthalidone 25 mg daily and nifedipine 90 mg XL daily.  She denies headache or chest pain.  Continue active plan for weight reduction, reducing intake of processed foods and restaurant meals.  Continue current medications.  Recommend monitoring home blood pressure readings once a week.   Morbid obesity Assessment & Plan: Reviewed patient's overall progress and her bioimpedance results.  She has been through some emotional changes after cleaning out her house following her mom's death.  She feels more ready to move forward with organizing her stuff, meal planning and adding in more regular exercise.  She has followed through with some suggested changes for breakfast and lunch.  We discussed easy options for dinner to limit meals out.  Continue category 3 meal plan and firm up plan for exercise to start in the next 4 weeks.   BMI 45.0-49.9, adult      She was informed of the importance of frequent follow up visits to maximize her success with intensive lifestyle modifications for her multiple health conditions.   ATTESTASTION STATEMENTS:  Reviewed by clinician on day of visit: allergies, medications, problem list, medical history, surgical history, family history, social history, and previous encounter notes pertinent to obesity diagnosis.   I have personally spent 30 minutes total time today in preparation, patient care, nutritional counseling and documentation for this visit, including the following: review of clinical lab tests; review of medical tests/procedures/services.      Gina Brink, DO DABFM,  DABOM [facility] [address] [place], [place] [address] [phone]

## 2022-10-18 NOTE — Assessment & Plan Note (Addendum)
Last vitamin D Lab Results  Component Value Date   VD25OH 27.5 (L) 09/19/2022   She is on ergocalciferol 50,000 IU once weekly.  Her energy level is starting to improve.  We reviewed her labs from her last visit.  Her vitamin D level did increase from 11.7-27.5.  We discussed a target vitamin D level of 50-70 to help with fatigue, leptin resistance and bone health.  Refilled ergocalciferol 50,000 IU once weekly.  Recheck level in 3 to 4 months.

## 2022-10-18 NOTE — Assessment & Plan Note (Signed)
Reviewed patient's overall progress and her bioimpedance results.  She has been through some emotional changes after cleaning out her house following her mom's death.  She feels more ready to move forward with organizing her stuff, meal planning and adding in more regular exercise.  She has followed through with some suggested changes for breakfast and lunch.  We discussed easy options for dinner to limit meals out.  Continue category 3 meal plan and firm up plan for exercise to start in the next 4 weeks.

## 2022-10-18 NOTE — Assessment & Plan Note (Addendum)
Lab Results  Component Value Date   HGBA1C 6.1 (H) 09/19/2022  Reviewed labs from last visit.  A1c has reduced from 6.2-6.1.  Her metformin was increased to 500 mg twice daily last visit. She is  having some lose stools but no other side effects. Has + fam hx of type II diabetes.  She is actively working on weight reduction, down 8 pounds in the past 5 months.  Continue working on a low sugar/low starch diet, regular exercise and weight reduction.  Continue metformin 500 mg twice daily.

## 2022-11-17 ENCOUNTER — Ambulatory Visit (INDEPENDENT_AMBULATORY_CARE_PROVIDER_SITE_OTHER): Payer: 59 | Admitting: Family Medicine

## 2022-11-17 ENCOUNTER — Encounter (INDEPENDENT_AMBULATORY_CARE_PROVIDER_SITE_OTHER): Payer: Self-pay | Admitting: Family Medicine

## 2022-11-17 VITALS — BP 137/83 | HR 92 | Temp 98.5°F | Ht 63.0 in | Wt 270.0 lb

## 2022-11-17 DIAGNOSIS — Z6841 Body Mass Index (BMI) 40.0 and over, adult: Secondary | ICD-10-CM

## 2022-11-17 DIAGNOSIS — R7303 Prediabetes: Secondary | ICD-10-CM | POA: Diagnosis not present

## 2022-11-17 DIAGNOSIS — E559 Vitamin D deficiency, unspecified: Secondary | ICD-10-CM | POA: Diagnosis not present

## 2022-11-17 MED ORDER — METFORMIN HCL 500 MG PO TABS: 500.0000 mg | ORAL_TABLET | Freq: Every day | ORAL | 0 refills | Status: DC

## 2022-11-17 MED ORDER — VITAMIN D (ERGOCALCIFEROL) 1.25 MG (50000 UNIT) PO CAPS: 50000.0000 [IU] | ORAL_CAPSULE | ORAL | 0 refills | Status: DC

## 2022-11-17 NOTE — Assessment & Plan Note (Signed)
Last vitamin D Lab Results  Component Value Date   VD25OH 27.5 (L) 09/19/2022   Doing well on RX Vitamin D 50,000 IU weekly Energy level has improved Denies adverse SE  Continue RX vitamin D 50,000 IU weekly Recheck level in 2 mos

## 2022-11-17 NOTE — Assessment & Plan Note (Signed)
Reviewed bioimpedence results She is retaining 4 lb of fluid (currently constipated) She is back of regular soda and has reduced meals out now that court case is over She has just started to walk the dog more and do home workouts Her motivation to continue to make positive changes is still there Emotional eating did get her off track  Resume Cat 3 meal plan Increase walking frequency Practice positive self talk and stress reduction Add in CBT with Dr Dewaine Conger for emotional eating

## 2022-11-17 NOTE — Progress Notes (Signed)
Office: [phone]  /  Fax: [phone]  WEIGHT SUMMARY AND BIOMETRICS  Starting Date: 05/26/22  Starting Weight: 276lb   Weight Lost Since Last Visit: 0   Vitals Temp: 98.5 F (36.9 C) BP: 137/83 Pulse Rate: 92 SpO2: 100 %   Body Composition  Body Fat %: 51.2 % Fat Mass (lbs): 138.4 lbs Muscle Mass (lbs): 125.4 lbs Total Body Water (lbs): 99.8 lbs Visceral Fat Rating : 17     HPI  Chief Complaint: OBESITY  Gina Walker is here to discuss her progress with her obesity treatment plan. She is on the the Category 3 Plan and states she is following her eating plan approximately 70 % of the time. She states she is exercising 60 minutes 2 times per week.   Interval History:  Since last office visit she is up 2 lb She is down 6 lb  She gained 1.4 lb of muscle mass and gained 0.8 lb of body fat Admits to getting off track with gong to court and was back to drinking sodas and snacking more She has been eating more fruit She is not sleeping well She is going to the gym doing cardio and weight training 2 x a week She started walking her dog outdoors She has cut back on meals out  Pharmacotherapy: metformin 500 mg bid for prediabetes  PHYSICAL EXAM:  Blood pressure 137/83, pulse 92, temperature 98.5 F (36.9 C), height 5\' 3"  (1.6 m), weight 270 lb (122.5 kg), SpO2 100 %. Body mass index is 47.83 kg/m.  General: She is overweight, cooperative, alert, well developed, and in no acute distress. PSYCH: Has normal mood, affect and thought process.   Lungs: Normal breathing effort, no conversational dyspnea.   ASSESSMENT AND PLAN  TREATMENT PLAN FOR OBESITY:  Recommended Dietary Goals  Gina Walker is currently in the action stage of change. As such, her goal is to continue weight management plan. She has agreed to the Category 3 Plan.  Behavioral Intervention  We discussed the following Behavioral Modification Strategies today: increasing lean protein intake,  decreasing simple carbohydrates , increasing vegetables, increasing lower glycemic fruits, increasing fiber rich foods, avoiding skipping meals, increasing water intake, work on meal planning and preparation, keeping healthy foods at home, work on managing stress, creating time for self-care and relaxation measures, continue to practice mindfulness when eating, and planning for success.  Additional resources provided today: NA  Recommended Physical Activity Goals  Gina Walker has been advised to work up to 150 minutes of moderate intensity aerobic activity a week and strengthening exercises 2-3 times per week for cardiovascular health, weight loss maintenance and preservation of muscle mass.   She has agreed to Increase the intensity, frequency or duration of aerobic exercises    Pharmacotherapy changes for the treatment of obesity:   ASSOCIATED CONDITIONS ADDRESSED TODAY  Pre-diabetes Assessment & Plan: Lab Results  Component Value Date   HGBA1C 6.1 (H) 09/19/2022   Doing well on metformin 500 mg bid without GI side effect but did forget to take it for ~2 weeks She is working on reducing intake of sugar and starches and has increased exercise frequency  Continue to work on prescribed meal plan, increased walking time Continue metformin 500 mg bid Recheck A1c, CMP, B12 in 2 mos   Orders: -     metFORMIN HCl; Take 1 tablet (500 mg total) by mouth daily with breakfast.  Dispense: 180 tablet; Refill: 0  Morbid obesity (HCC) with starting BMI 48.9 Assessment & Plan: Reviewed bioimpedence  results She is retaining 4 lb of fluid (currently constipated) She is back of regular soda and has reduced meals out now that court case is over She has just started to walk the dog more and do home workouts Her motivation to continue to make positive changes is still there Emotional eating did get her off track  Resume Cat 3 meal plan Increase walking frequency Practice positive self talk and stress  reduction Add in CBT with Dr Dewaine Conger for emotional eating   Vitamin D deficiency Assessment & Plan: Last vitamin D Lab Results  Component Value Date   VD25OH 27.5 (L) 09/19/2022   Doing well on RX Vitamin D 50,000 IU weekly Energy level has improved Denies adverse SE  Continue RX vitamin D 50,000 IU weekly Recheck level in 2 mos  Orders: -     Vitamin D (Ergocalciferol); Take 1 capsule (50,000 Units total) by mouth every 7 (seven) days.  Dispense: 12 capsule; Refill: 0  BMI 45.0-49.9, adult [facility])      She was informed of the importance of frequent follow up visits to maximize her success with intensive lifestyle modifications for her multiple health conditions.   ATTESTASTION STATEMENTS:  Reviewed by clinician on day of visit: allergies, medications, problem list, medical history, surgical history, family history, social history, and previous encounter notes pertinent to obesity diagnosis.   I have personally spent 30 minutes total time today in preparation, patient care, nutritional counseling and documentation for this visit, including the following: review of clinical lab tests; review of medical tests/procedures/services.      Gina Brink, DO DABFM, DABOM [facility] [address] [place], [place] [address] [phone]

## 2022-11-17 NOTE — Assessment & Plan Note (Addendum)
Lab Results  Component Value Date   HGBA1C 6.1 (H) 09/19/2022   Doing well on metformin 500 mg bid without GI side effect but did forget to take it for ~2 weeks She is working on reducing intake of sugar and starches and has increased exercise frequency  Continue to work on prescribed meal plan, increased walking time Continue metformin 500 mg bid Recheck A1c, CMP, B12 in 2 mos

## 2022-12-15 ENCOUNTER — Encounter (INDEPENDENT_AMBULATORY_CARE_PROVIDER_SITE_OTHER): Payer: Self-pay | Admitting: Family Medicine

## 2022-12-15 ENCOUNTER — Ambulatory Visit (INDEPENDENT_AMBULATORY_CARE_PROVIDER_SITE_OTHER): Payer: 59 | Admitting: Family Medicine

## 2022-12-15 VITALS — BP 144/88 | HR 96 | Temp 98.3°F | Ht 63.0 in | Wt 267.0 lb

## 2022-12-15 DIAGNOSIS — I10 Essential (primary) hypertension: Secondary | ICD-10-CM | POA: Diagnosis not present

## 2022-12-15 DIAGNOSIS — R7303 Prediabetes: Secondary | ICD-10-CM | POA: Diagnosis not present

## 2022-12-15 DIAGNOSIS — Z6841 Body Mass Index (BMI) 40.0 and over, adult: Secondary | ICD-10-CM

## 2022-12-15 NOTE — Assessment & Plan Note (Signed)
Lab Results  Component Value Date   HGBA1C 6.1 (H) 09/19/2022   She is doing well on metformin 500 mg twice daily for prediabetes.  She denies any GI side effects.  She is actively working on weight reduction, improve diet with reducing intake of added sugar and refined carbohydrates.  She has not yet added in formal exercise but has been more physically active.  Recommend ramping up walking time to 30 minutes at least 3 days a week.  Avoid food and drink with over 8 g of added sugar per serving.  Continue metformin 500 mg twice daily with food.  Recheck A1c next visit.

## 2022-12-15 NOTE — Progress Notes (Signed)
Gina Walker  Office: [phone]  /  Fax: [phone]  WEIGHT SUMMARY AND BIOMETRICS  Starting Date: 05/26/22  Starting Weight: 276lb   Weight Lost Since Last Visit: 3lb   Vitals Temp: 98.3 F (36.8 C) BP: (!) 144/88 Pulse Rate: 96 SpO2: 99 %   Body Composition  Body Fat %: 49.3 % Fat Mass (lbs): 131.6 lbs Muscle Mass (lbs): 128.6 lbs Total Body Water (lbs): 101.4 lbs Visceral Fat Rating : 16     HPI  Chief Complaint: OBESITY  Gina Walker is here to discuss her progress with her obesity treatment plan. Gina Walker is on the the Category 3 Plan and states Gina Walker is following her eating plan approximately 55 % of the time. Gina Walker states Gina Walker is exercising 30 minutes 7 times per week.   Interval History:  Since last office visit Gina Walker is down 3 lb Her dog had 10 puppies and Gina Walker has been more active Gina Walker is drinking some soda, one regular soda several days/ wk Denies adverse SE from metformin Walking more Total weight loss is 8 lb in the past 7 mos This is a 3.2% total body weight loss Gina Walker has a history of frequent meals out, high stress levels. Gina Walker is in CBT for emotional eating   Pharmacotherapy: Metformin 500 mg twice daily for prediabetes  PHYSICAL EXAM:  Blood pressure (!) 144/88, pulse 96, temperature 98.3 F (36.8 C), height 5\' 3"  (1.6 m), weight 267 lb (121.1 kg), SpO2 99 %. Body mass index is 47.3 kg/m.  General: Gina Walker is overweight, cooperative, alert, well developed, and in no acute distress. PSYCH: Has normal mood, affect and thought process.   Lungs: Normal breathing effort, no conversational dyspnea.   ASSESSMENT AND PLAN  TREATMENT PLAN FOR OBESITY:  Recommended Dietary Goals  Gina Walker is currently in the action stage of change. As such, her goal is to continue weight management plan. Gina Walker has agreed to the Category 3 Plan.  Behavioral Intervention  We discussed the following Behavioral Modification Strategies today: increasing lean protein intake, decreasing simple  carbohydrates , increasing vegetables, increasing lower glycemic fruits, increasing water intake, work on meal planning and preparation, reading food labels , keeping healthy foods at home, identifying sources and decreasing liquid calories, continue to practice mindfulness when eating, and planning for success.  Additional resources provided today: NA  Recommended Physical Activity Goals  Gina Walker has been advised to work up to 150 minutes of moderate intensity aerobic activity a week and strengthening exercises 2-3 times per week for cardiovascular health, weight loss maintenance and preservation of muscle mass.   Gina Walker has agreed to Start aerobic activity with a goal of 150 minutes a week at moderate intensity.   Pharmacotherapy changes for the treatment of obesity: None  ASSOCIATED CONDITIONS ADDRESSED TODAY  Pre-diabetes Assessment & Plan: Lab Results  Component Value Date   HGBA1C 6.1 (H) 09/19/2022   Gina Walker is doing well on metformin 500 mg twice daily for prediabetes.  Gina Walker denies any GI side effects.  Gina Walker is actively working on weight reduction, improve diet with reducing intake of added sugar and refined carbohydrates.  Gina Walker has not yet added in formal exercise but has been more physically active.  Recommend ramping up walking time to 30 minutes at least 3 days a week.  Avoid food and drink with over 8 g of added sugar per serving.  Continue metformin 500 mg twice daily with food.  Recheck A1c next visit.   Morbid obesity (HCC) with starting BMI 48.9 Assessment & Plan:  Reviewed bioimpedance results.  Reviewed patient's overall progress.  Gina Walker has lost 9 pounds in the past 7 months of medically supervised weight management.  This is a 3.2% total body weight loss which is less than expected.  Gina Walker would be a good candidate for use of a GLP-1 or GLP-1/GIP receptor agonist for obesity treatment but these are currently cost prohibitive without insurance coverage.  Avoid use of any antiobesity  medications that could increase blood pressure or cause mood side effects.  Gina Walker has declined the option for weight loss surgery at this point in time though her BMI is 47. Gina Walker has room for improvement with compliance on her prescribed meal plan.  Continue to work on reducing frequency of meals out, cooking at home, meal planning and getting lean protein and fiber with meals.  Recommend tracking daily steps and getting in more formal walking.     BMI 45.0-49.9, adult (HCC)  Primary hypertension Assessment & Plan: Blood pressure is elevated today.  Gina Walker denies headache or chest pain.  Gina Walker does report being out of her blood pressure medications for several days but has recently restarted.  Gina Walker is currently on nifedipine 90 mg extended release daily, chlorthalidone 25 mg once daily.  Gina Walker has not been checking her home blood pressure readings.  Recommend monitoring her resting blood pressure readings 2 times a week.  Bring in log next visit.  Follow-up with PCP and or cardiology to manage hypertension.  Encouraged her to take all of her blood pressure medication as directed on a daily basis.       Gina Walker was informed of the importance of frequent follow up visits to maximize her success with intensive lifestyle modifications for her multiple health conditions.   ATTESTASTION STATEMENTS:  Reviewed by clinician on day of visit: allergies, medications, problem list, medical history, surgical history, family history, social history, and previous encounter notes pertinent to obesity diagnosis.   I have personally spent 30 minutes total time today in preparation, patient care, nutritional counseling and documentation for this visit, including the following: review of clinical lab tests; review of medical tests/procedures/services.      Glennis Brink, DO DABFM, DABOM [facility] [address] [place], [place] [address] [phone]

## 2022-12-15 NOTE — Assessment & Plan Note (Signed)
Reviewed bioimpedance results.  Reviewed patient's overall progress.  She has lost 9 pounds in the past 7 months of medically supervised weight management.  This is a 3.2% total body weight loss which is less than expected.  She would be a good candidate for use of a GLP-1 or GLP-1/GIP receptor agonist for obesity treatment but these are currently cost prohibitive without insurance coverage.  Avoid use of any antiobesity medications that could increase blood pressure or cause mood side effects.  She has declined the option for weight loss surgery at this point in time though her BMI is 47. She has room for improvement with compliance on her prescribed meal plan.  Continue to work on reducing frequency of meals out, cooking at home, meal planning and getting lean protein and fiber with meals.  Recommend tracking daily steps and getting in more formal walking.

## 2022-12-15 NOTE — Assessment & Plan Note (Signed)
Blood pressure is elevated today.  She denies headache or chest pain.  She does report being out of her blood pressure medications for several days but has recently restarted.  She is currently on nifedipine 90 mg extended release daily, chlorthalidone 25 mg once daily.  She has not been checking her home blood pressure readings.  Recommend monitoring her resting blood pressure readings 2 times a week.  Bring in log next visit.  Follow-up with PCP and or cardiology to manage hypertension.  Encouraged her to take all of her blood pressure medication as directed on a daily basis.

## 2022-12-27 ENCOUNTER — Telehealth (INDEPENDENT_AMBULATORY_CARE_PROVIDER_SITE_OTHER): Payer: 59 | Admitting: Psychology

## 2022-12-27 DIAGNOSIS — F5089 Other specified eating disorder: Secondary | ICD-10-CM | POA: Diagnosis not present

## 2022-12-27 DIAGNOSIS — F331 Major depressive disorder, recurrent, moderate: Secondary | ICD-10-CM
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
## 2023-01-24 ENCOUNTER — Encounter (INDEPENDENT_AMBULATORY_CARE_PROVIDER_SITE_OTHER): Payer: Self-pay | Admitting: Family Medicine

## 2023-01-24 ENCOUNTER — Ambulatory Visit (INDEPENDENT_AMBULATORY_CARE_PROVIDER_SITE_OTHER): Payer: 59 | Admitting: Family Medicine

## 2023-01-24 VITALS — BP 142/91 | HR 91 | Temp 97.7°F | Ht 63.0 in | Wt 267.0 lb

## 2023-01-24 DIAGNOSIS — E559 Vitamin D deficiency, unspecified: Secondary | ICD-10-CM

## 2023-01-24 DIAGNOSIS — I10 Essential (primary) hypertension: Secondary | ICD-10-CM | POA: Diagnosis not present

## 2023-01-24 DIAGNOSIS — Z6841 Body Mass Index (BMI) 40.0 and over, adult: Secondary | ICD-10-CM

## 2023-01-24 DIAGNOSIS — R7303 Prediabetes: Secondary | ICD-10-CM

## 2023-01-24 MED ORDER — VITAMIN D (ERGOCALCIFEROL) 1.25 MG (50000 UNIT) PO CAPS: 50000.0000 [IU] | ORAL_CAPSULE | ORAL | 0 refills | Status: DC

## 2023-01-24 NOTE — Progress Notes (Signed)
Office: [phone]  /  Fax: [phone]  WEIGHT SUMMARY AND BIOMETRICS  Starting Date: 05/26/22  Starting Weight: 276lb   Weight Lost Since Last Visit: 0lb   Vitals Temp: 97.7 F (36.5 C) BP: (!) 142/91 Pulse Rate: 91 SpO2: 99 %   Body Composition  Body Fat %: 48.3 % Fat Mass (lbs): 129.4 lbs Muscle Mass (lbs): 131.4 lbs Total Body Water (lbs): 104.6 lbs Visceral Fat Rating : 16     HPI  Chief Complaint: OBESITY  Gina Walker is here to discuss her progress with her obesity treatment plan. She is on the the Category 3 Plan and states she is following her eating plan approximately 40 % of the time. She states she is exercising 0 minutes 0 times per week.   Interval History:  Since last office visit she is down 0 lb This gives her a net weight loss of 9 lb in the past 8 mos of medically supervised weight mgmt She is up 2.8 lb of muscle mass and is down 2.2 lb of body fat She had COVID-19 for 2 weeks this month She admits to eating off plan and was not hungry while sick She is lacking adequate protein intake and hates to cook She is still eating out frequently and eating convenience foods She has not been consistent with exercise She is working on reducing SSBs  She is taking metformin 500 mg bid for PDM She is on RX vitamin D weekly  Pharmacotherapy: metformin 500 mg   PHYSICAL EXAM:  Blood pressure (!) 142/91, pulse 91, temperature 97.7 F (36.5 C), height 5\' 3"  (1.6 m), weight 267 lb (121.1 kg), SpO2 99%. Body mass index is 47.3 kg/m.  General: She is overweight, cooperative, alert, well developed, and in no acute distress. PSYCH: Has normal mood, affect and thought process.   Lungs: Normal breathing effort, no conversational dyspnea.   ASSESSMENT AND PLAN  TREATMENT PLAN FOR OBESITY:  Recommended Dietary Goals  Gina Walker is currently in the action stage of change. As such, her goal is to continue weight management plan. She has agreed to the  Category 3 Plan.  Behavioral Intervention  We discussed the following Behavioral Modification Strategies today: increasing lean protein intake, decreasing simple carbohydrates , increasing vegetables, increasing lower glycemic fruits, increasing fiber rich foods, increasing water intake, keeping healthy foods at home, continue to work on implementation of reduced calorie nutritional plan, continue to practice mindfulness when eating, and planning for success.  Additional resources provided today: NA  Recommended Physical Activity Goals  Gina Walker has been advised to work up to 150 minutes of moderate intensity aerobic activity a week and strengthening exercises 2-3 times per week for cardiovascular health, weight loss maintenance and preservation of muscle mass.   She has agreed to Start aerobic activity with a goal of 150 minutes a week at moderate intensity.   Pharmacotherapy changes for the treatment of obesity: no changes  ASSOCIATED CONDITIONS ADDRESSED TODAY  Primary hypertension Assessment & Plan: BP is elevated today.  She reports not taking her Nifedipine 90 mg XL this morning OR her Chlorthalidone 25 mg daily.  She no longer has batteries for her home BP cuff.    We discussed the importance of taking all of her medications daily as directed Recommend getting batteries today for her home BP cuff and to send a resting BP reading via MyChart in the next 5 days to better assess her BP control.  Continue active plan for weight reduction.  Orders: -  CBC  Pre-diabetes Assessment & Plan: Lab Results  Component Value Date   HGBA1C 6.1 (H) 09/19/2022   Doing well on metformin 500 mg bid without adverse SE Working on a reduced kcal dietary plan, reducing high sugar items and excess starches Exercise is lacking  Update labs today.  Continue healthy lifestyle changes Continue metformin 500 mg bid  Orders: -     Hemoglobin A1c -     Vitamin B12 -     Comprehensive metabolic  panel  Vitamin D deficiency Assessment & Plan: Last vitamin D Lab Results  Component Value Date   VD25OH 27.5 (L) 09/19/2022   She remains on RX vitamin D weekly Energy level has been low (getting over COVID currently) Denies adverse SE from vitamin D  Continue Rx vitamin D weekly.  Recheck level today.  Orders: -     Vitamin D (Ergocalciferol); Take 1 capsule (50,000 Units total) by mouth every 7 (seven) days.  Dispense: 12 capsule; Refill: 0 -     VITAMIN D 25 Hydroxy (Vit-D Deficiency, Fractures)  Morbid obesity (HCC) with starting BMI 48.9  BMI 45.0-49.9, adult (HCC)      She was informed of the importance of frequent follow up visits to maximize her success with intensive lifestyle modifications for her multiple health conditions.   ATTESTASTION STATEMENTS:  Reviewed by clinician on day of visit: allergies, medications, problem list, medical history, surgical history, family history, social history, and previous encounter notes pertinent to obesity diagnosis.   I have personally spent 30 minutes total time today in preparation, patient care, nutritional counseling and documentation for this visit, including the following: review of clinical lab tests; review of medical tests/procedures/services.      Glennis Brink, DO DABFM, DABOM [facility] [address] [place], [place] [address] [phone]

## 2023-01-24 NOTE — Assessment & Plan Note (Signed)
Lab Results  Component Value Date   HGBA1C 6.1 (H) 09/19/2022   Doing well on metformin 500 mg bid without adverse SE Working on a reduced kcal dietary plan, reducing high sugar items and excess starches Exercise is lacking  Update labs today.  Continue healthy lifestyle changes Continue metformin 500 mg bid

## 2023-01-24 NOTE — Assessment & Plan Note (Signed)
BP is elevated today.  She reports not taking her Nifedipine 90 mg XL this morning OR her Chlorthalidone 25 mg daily.  She no longer has batteries for her home BP cuff.    We discussed the importance of taking all of her medications daily as directed Recommend getting batteries today for her home BP cuff and to send a resting BP reading via MyChart in the next 5 days to better assess her BP control.  Continue active plan for weight reduction.

## 2023-01-24 NOTE — Assessment & Plan Note (Signed)
Last vitamin D Lab Results  Component Value Date   VD25OH 27.5 (L) 09/19/2022   She remains on RX vitamin D weekly Energy level has been low (getting over COVID currently) Denies adverse SE from vitamin D  Continue Rx vitamin D weekly.  Recheck level today.

## 2023-01-30 ENCOUNTER — Telehealth (INDEPENDENT_AMBULATORY_CARE_PROVIDER_SITE_OTHER): Payer: Self-pay | Admitting: Psychology

## 2023-01-30 ENCOUNTER — Encounter (INDEPENDENT_AMBULATORY_CARE_PROVIDER_SITE_OTHER): Payer: Self-pay

## 2023-01-30 ENCOUNTER — Telehealth (INDEPENDENT_AMBULATORY_CARE_PROVIDER_SITE_OTHER): Payer: 59 | Admitting: Psychology

## 2023-01-30 NOTE — Progress Notes (Unsigned)
  Office: [phone]  /  Fax: [phone]    Date: January 30, 2023  Appointment Start Time: *** Duration: *** minutes Provider: Lawerance Cruel, Psy.D. Type of Session: Individual Therapy  Location of Patient: {gbptloc:23249} (private location) Location of Provider: Provider's Home (private office) Type of Contact: Telepsychological Visit via MyChart Video Visit  Session Content: This provider called Gina Walker at 10:02am as she did not present for today's appointment. A HIPAA compliant voicemail was left requesting a call back. She was observed joining at ***. Gina Walker is a 42 y.o. female presenting for a follow-up appointment to address the previously established treatment goal of increasing coping skills.Today's appointment was a telepsychological visit. Gina Walker provided verbal consent for today's telepsychological appointment and she is aware she is responsible for securing confidentiality on her end of the session. Prior to proceeding with today's appointment, Gina Walker's physical location at the time of this appointment was obtained as well a phone number she could be reached at in the event of technical difficulties. Gina Walker and this provider participated in today's telepsychological service.   This provider conducted a brief check-in. *** Gina Walker was receptive to today's appointment as evidenced by openness to sharing, responsiveness to feedback, and {gbreceptiveness:23401}.  Mental Status Examination:  Appearance: {Appearance:22431} Behavior: {Behavior:22445} Mood: {gbmood:21757} Affect: {Affect:22436} Speech: {Speech:22432} Eye Contact: {Eye Contact:22433} Psychomotor Activity: {Motor Activity:22434} Gait: {gbgait:23404} Thought Process: {thought process:22448}  Thought Content/Perception: {disturbances:22451} Orientation: {Orientation:22437} Memory/Concentration: {gbcognition:22449} Insight: {Insight:22446} Judgment: {Insight:22446}  Interventions:  {Interventions for Progress  Notes:23405}  DSM-5 Diagnosis(es):  F50.89 Other Specified Feeding or Eating Disorder, Emotional and Binge Eating Behaviors and F33.1  Major Depressive Disorder, Recurrent Episode, Moderate  Treatment Goal & Progress: During the initial appointment with this provider, the following treatment goal was established: increase coping skills. Gina Walker has demonstrated progress in her goal as evidenced by {gbtxprogress:22839}. Gina Walker also {gbtxprogress2:22951}.  Plan: The next appointment is scheduled for *** at ***, which will be via MyChart Video Visit. The next session will focus on {Plan for Next Appointment:23400}.

## 2023-01-30 NOTE — Telephone Encounter (Signed)
  Office: [phone]  /  Fax: [phone]  Date of Call: January 30, 2023  Time of Call: 10:02am Provider: Lawerance Cruel, PsyD  CONTENT: This provider called Gina Walker to check-in as she did not present for today's MyChart Video Visit appointment. A HIPAA compliant voicemail was left requesting a call back. Of note, this provider stayed on the MyChart Video Visit appointment for 5 minutes prior to signing off per the clinic's grace period policy.    PLAN: This provider will wait for [name] to call back. No further follow-up planned by this provider.

## 2023-02-21 ENCOUNTER — Other Ambulatory Visit (INDEPENDENT_AMBULATORY_CARE_PROVIDER_SITE_OTHER): Payer: Self-pay | Admitting: Family Medicine

## 2023-02-21 ENCOUNTER — Ambulatory Visit (INDEPENDENT_AMBULATORY_CARE_PROVIDER_SITE_OTHER): Payer: 59 | Admitting: Family Medicine

## 2023-02-21 ENCOUNTER — Encounter (INDEPENDENT_AMBULATORY_CARE_PROVIDER_SITE_OTHER): Payer: Self-pay | Admitting: Family Medicine

## 2023-02-21 VITALS — BP 150/94 | HR 86 | Temp 98.8°F | Ht 63.0 in | Wt 270.0 lb

## 2023-02-21 DIAGNOSIS — I10 Essential (primary) hypertension: Secondary | ICD-10-CM

## 2023-02-21 DIAGNOSIS — E559 Vitamin D deficiency, unspecified: Secondary | ICD-10-CM | POA: Diagnosis not present

## 2023-02-21 DIAGNOSIS — R7303 Prediabetes: Secondary | ICD-10-CM

## 2023-02-21 DIAGNOSIS — Z6841 Body Mass Index (BMI) 40.0 and over, adult: Secondary | ICD-10-CM

## 2023-02-21 DIAGNOSIS — E669 Obesity, unspecified: Secondary | ICD-10-CM | POA: Diagnosis not present

## 2023-02-21 MED ORDER — VITAMIN D (ERGOCALCIFEROL) 1.25 MG (50000 UNIT) PO CAPS: 50000.0000 [IU] | ORAL_CAPSULE | ORAL | 0 refills | Status: DC

## 2023-02-21 MED ORDER — NIFEDIPINE ER OSMOTIC RELEASE 90 MG PO TB24: 90.0000 mg | ORAL_TABLET | Freq: Every day | ORAL | 0 refills | Status: DC

## 2023-02-21 MED ORDER — CHLORTHALIDONE 50 MG PO TABS: 50.0000 mg | ORAL_TABLET | Freq: Every day | ORAL | 0 refills | Status: DC

## 2023-02-21 MED ORDER — METFORMIN HCL 500 MG PO TABS: 500.0000 mg | ORAL_TABLET | Freq: Every day | ORAL | 0 refills | Status: DC

## 2023-02-22 NOTE — Progress Notes (Signed)
Chief Complaint:   OBESITY Gina Walker is here to discuss her progress with her obesity treatment plan along with follow-up of her obesity related diagnoses. Gina Walker is on the Category 3 Plan and states she is following her eating plan approximately 30% of the time. Gina Walker states she is doing 0 minutes 0 times per week.  Today's visit was #: 10 Starting weight: 276 lbs Starting date: 05/26/2022 Today's weight: 270 lbs Today's date: 02/21/2023 Total lbs lost to date: 6 Total lbs lost since last in-office visit: 0  Interim History: Patient has struggled with her category 3 over the last few weeks.  She is trying to stay mindful of her food choices and increase her total protein amount, and limiting calories.  She did travel to [place] since her last appointment.  Subjective:   1. Primary hypertension Patient's blood pressure is elevated today. She denies chest pain, chest pressure, or headache. She has not been on medications for a bit.   2. Pre-diabetes Patient's last A1c was 6.0 at her last appointment. I discussed labs with the patient today.   3. Vitamin D deficiency Patient notes fatigue. She denies nausea, vomiting, or muscle weakness. She is on prescription Vitamin D. I discussed labs with the patient today.   Assessment/Plan:   1. Primary hypertension Patient will continue her medications, and we will refill Procardia for 90 days and chlorthalidone for 1 month.   - NIFEdipine (PROCARDIA XL/NIFEDICAL-XL) 90 MG 24 hr tablet; Take 1 tablet (90 mg total) by mouth daily.  Dispense: 90 tablet; Refill: 0 - chlorthalidone (HYGROTON) 50 MG tablet; Take 1 tablet (50 mg total) by mouth daily.  Dispense: 30 tablet; Refill: 0  2. Pre-diabetes Patient will continue metformin 500 mg once daily, and we will refill for 90 days.   - metFORMIN (GLUCOPHAGE) 500 MG tablet; Take 1 tablet (500 mg total) by mouth daily with breakfast.  Dispense: 180 tablet; Refill: 0  3. Vitamin D  deficiency Patient will continue prescription Vitamin D once weekly, and we will for 90 days.   - Vitamin D, Ergocalciferol, (DRISDOL) 1.25 MG (50000 UNIT) CAPS capsule; Take 1 capsule (50,000 Units total) by mouth every 7 (seven) days.  Dispense: 12 capsule; Refill: 0  4. BMI 45.0-49.9, adult (HCC)  5. Obesity with starting BMI of 49.0 Gina Walker is currently in the action stage of change. As such, her goal is to continue with weight loss efforts. She has agreed to the Category 3 Plan.   Exercise goals: No exercise has been prescribed at this time.  Behavioral modification strategies: increasing lean protein intake, meal planning and cooking strategies, keeping healthy foods in the home, and planning for success.  Gina Walker has agreed to follow-up with our clinic in 4 weeks. She was informed of the importance of frequent follow-up visits to maximize her success with intensive lifestyle modifications for her multiple health conditions.   Objective:   Blood pressure (!) 150/94, pulse 86, temperature 98.8 F (37.1 C), height 5\' 3"  (1.6 m), weight 270 lb (122.5 kg), SpO2 100%. Body mass index is 47.83 kg/m.  General: Cooperative, alert, well developed, in no acute distress. HEENT: Conjunctivae and lids unremarkable. Cardiovascular: Regular rhythm.  Lungs: Normal work of breathing. Neurologic: No focal deficits.   Lab Results  Component Value Date   CREATININE 0.85 01/24/2023   BUN 9 01/24/2023   NA 142 01/24/2023   K 3.6 01/24/2023   CL 103 01/24/2023   CO2 24 01/24/2023   Lab Results  Component Value Date   ALT 28 01/24/2023   AST 23 01/24/2023   ALKPHOS 67 01/24/2023   BILITOT 0.2 01/24/2023   Lab Results  Component Value Date   HGBA1C 6.0 (H) 01/24/2023   HGBA1C 6.1 (H) 09/19/2022   HGBA1C 6.2 (H) 05/26/2022   HGBA1C 6.2 (H) 10/14/2019   Lab Results  Component Value Date   INSULIN 32.5 (H) 05/26/2022   Lab Results  Component Value Date   TSH 1.490 05/26/2022   Lab  Results  Component Value Date   CHOL 237 (H) 05/26/2022   HDL 62 05/26/2022   LDLCALC 159 (H) 05/26/2022   TRIG 90 05/26/2022   CHOLHDL 3.8 05/26/2022   Lab Results  Component Value Date   VD25OH 37.7 01/24/2023   VD25OH 27.5 (L) 09/19/2022   VD25OH 11.7 (L) 05/26/2022   Lab Results  Component Value Date   WBC 4.7 01/24/2023   HGB 12.9 01/24/2023   HCT 38.8 01/24/2023   MCV 86 01/24/2023   PLT 322 01/24/2023   No results found for: "IRON", "TIBC", "FERRITIN"  Attestation Statements:   Reviewed by clinician on day of visit: allergies, medications, problem list, medical history, surgical history, family history, social history, and previous encounter notes.   I, Burt Knack, am acting as transcriptionist for Reuben Likes, MD.  I have reviewed the above documentation for accuracy and completeness, and I agree with the above. - Reuben Likes, MD

## 2023-02-28 ENCOUNTER — Telehealth (INDEPENDENT_AMBULATORY_CARE_PROVIDER_SITE_OTHER): Payer: 59 | Admitting: Psychology

## 2023-02-28 DIAGNOSIS — F331 Major depressive disorder, recurrent, moderate: Secondary | ICD-10-CM

## 2023-02-28 DIAGNOSIS — F5089 Other specified eating disorder: Secondary | ICD-10-CM

## 2023-02-28 NOTE — Progress Notes (Signed)
  Office: [phone]  /  Fax: [phone]    Date: February 28, 2023  Appointment Start Time: 8:31am Duration: 26 minutes Provider: Lawerance Cruel, Psy.D. Type of Session: Individual Therapy  Location of Patient: Home (private location) Location of Provider: Provider's Home (private office) Type of Contact: Telepsychological Visit via MyChart Video Visit  Session Content: Gina Walker is a 42 y.o. female presenting for a follow-up appointment to address the previously established treatment goal of increasing coping skills.Today's appointment was a telepsychological visit. Gina Walker provided verbal consent for today's telepsychological appointment and she is aware she is responsible for securing confidentiality on her end of the session. Prior to proceeding with today's appointment, Gina Walker physical location at the time of this appointment was obtained as well a phone number she could be reached at in the event of technical difficulties. Larrissa and this provider participated in today's telepsychological service.   This provider conducted a brief check-in. Gina Walker shared she has been "sick."  She reported a decrease in appetite in the beginning of her illness, adding she has "been eating three times a day" in the past week. Notably, she described focusing on her protein intake. Additionally, Gina Walker shared about her recent labs, noting, "They're really bad." Psychoeducation provided regarding self-compassion. Gina Walker was engaged in a self-compassion exercise to help with eating-related challenges and other ongoing stressors. She was encouraged to regularly ask herself, "What do I need right now?" and "How can I comfort and care for myself in this moment?" Overall, Gina Walker was receptive to today's appointment as evidenced by openness to sharing, responsiveness to feedback, and willingness to work toward increasing self-compassion.  Mental Status Examination:  Appearance: neat Behavior: appropriate to  circumstances Mood: sad Affect: mood congruent; tearful at times Speech: WNL Eye Contact: appropriate Psychomotor Activity: WNL Gait: unable to assess Thought Process: linear, logical, and goal directed and no evidence or endorsement of suicidal, homicidal, and self-harm ideation, plan and intent  Thought Content/Perception: no hallucinations, delusions, bizarre thinking or behavior endorsed or observed Orientation: AAOx4 Memory/Concentration: intact Insight: fair Judgment: fair  Interventions:  Conducted a brief chart review Provided empathic reflections and validation Employed supportive psychotherapy interventions to facilitate reduced distress and to improve coping skills with identified stressors Psychoeducation provided regarding self-compassion Engaged pt in a self-compassion exercise  DSM-5 Diagnosis(es):  F50.89 Other Specified Feeding or Eating Disorder, Emotional and Binge Eating Behaviors and F33.1  Major Depressive Disorder, Recurrent Episode, Moderate  Treatment Goal & Progress: During the initial appointment with this provider, the following treatment goal was established: increase coping skills. Progress is limited, as Gina Walker has just begun treatment with this provider; however, she is receptive to the interaction and interventions and rapport is being established.   Plan: The next appointment is scheduled for 03/21/2023 at 8:30am, which will be via MyChart Video Visit. The next session will focus on working towards the established treatment goal. Gina Walker acknowledged she has not established care with a psychiatrist and therapist. She provided verbal consent for this provider re-send referrals via e-mail.

## 2023-03-21 ENCOUNTER — Telehealth (INDEPENDENT_AMBULATORY_CARE_PROVIDER_SITE_OTHER): Payer: 59 | Admitting: Psychology

## 2023-03-21 DIAGNOSIS — F331 Major depressive disorder, recurrent, moderate: Secondary | ICD-10-CM

## 2023-03-21 DIAGNOSIS — F5089 Other specified eating disorder: Secondary | ICD-10-CM

## 2023-03-21 NOTE — Progress Notes (Signed)
  Office: [phone]  /  Fax: [phone]    Date: March 21, 2023  Appointment Start Time: 8:32am Duration: 21 minutes Provider: Lawerance Cruel, Psy.D. Type of Session: Individual Therapy  Location of Patient: Home (private location) Location of Provider: Provider's Home (private office) Type of Contact: Telepsychological Visit via MyChart Video Visit  Session Content: Ofelia is a 42 y.o. female presenting for a follow-up appointment to address the previously established treatment goal of increasing coping skills.Today's appointment was a telepsychological visit. Rozella provided verbal consent for today's telepsychological appointment and she is aware she is responsible for securing confidentiality on her end of the session. Prior to proceeding with today's appointment, Cassie's physical location at the time of this appointment was obtained as well a phone number she could be reached at in the event of technical difficulties. Neenah and this provider participated in today's telepsychological service.   This provider conducted a brief check-in. Malkah shared, "I've been doing a lot better actually." She further shared specifics of what she is eating. Reviewed self-compassion. Moreover, psychoeducation regarding triggers for emotional eating was provided. Alvin was provided a handout, and encouraged to utilize the handout between now and the next appointment to increase awareness of triggers and frequency. Sue Lush agreed. This provider also discussed behavioral strategies for specific triggers, such as placing the utensil down when conversing to avoid mindless eating. Zaidah provided verbal consent during today's appointment for this provider to send a handout about triggers via e-mail. Overall, Breklynn was receptive to today's appointment as evidenced by openness to sharing, responsiveness to feedback, and willingness to explore triggers for emotional eating.  Mental Status Examination:   Appearance: neat Behavior: appropriate to circumstances Mood: neutral Affect: mood congruent Speech: WNL Eye Contact: appropriate Psychomotor Activity: WNL Gait: unable to assess Thought Process: linear, logical, and goal directed and no evidence or endorsement of suicidal, homicidal, and self-harm ideation, plan and intent  Thought Content/Perception: no hallucinations, delusions, bizarre thinking or behavior endorsed or observed Orientation: AAOx4 Memory/Concentration: intact Insight: fair Judgment: fair  Interventions:  Conducted a brief chart review Provided empathic reflections and validation Reviewed content from the previous session Provided positive reinforcement Employed supportive psychotherapy interventions to facilitate reduced distress and to improve coping skills with identified stressors Psychoeducation provided regarding triggers for emotional eating behaviors  DSM-5 Diagnosis(es):  F50.89 Other Specified Feeding or Eating Disorder, Emotional and Binge Eating Behaviors and F33.1  Major Depressive Disorder, Recurrent Episode, Moderate  Treatment Goal & Progress: During the initial appointment with this provider, the following treatment goal was established: increase coping skills. Shantez has demonstrated progress in her goal as evidenced by increased awareness of hunger patterns. Karelin also continues to demonstrate willingness to engage in learned skill(s).  Plan: The next appointment is scheduled for 04/11/2023 at 9am, which will be via MyChart Video Visit. The next session will focus on working towards the established treatment goal. Jennesa reported she will be initiating therapeutic services via EAP this Saturday.

## 2023-03-27 ENCOUNTER — Encounter (INDEPENDENT_AMBULATORY_CARE_PROVIDER_SITE_OTHER): Payer: Self-pay | Admitting: Adult Health

## 2023-03-27 ENCOUNTER — Ambulatory Visit (INDEPENDENT_AMBULATORY_CARE_PROVIDER_SITE_OTHER): Payer: 59 | Admitting: Adult Health

## 2023-03-27 VITALS — BP 162/100 | HR 86 | Temp 98.2°F | Ht 63.0 in | Wt 268.0 lb

## 2023-03-27 DIAGNOSIS — E559 Vitamin D deficiency, unspecified: Secondary | ICD-10-CM

## 2023-03-27 DIAGNOSIS — I10 Essential (primary) hypertension: Secondary | ICD-10-CM

## 2023-03-27 DIAGNOSIS — Z6841 Body Mass Index (BMI) 40.0 and over, adult: Secondary | ICD-10-CM

## 2023-03-27 DIAGNOSIS — R7303 Prediabetes: Secondary | ICD-10-CM

## 2023-03-27 DIAGNOSIS — E669 Obesity, unspecified: Secondary | ICD-10-CM

## 2023-03-27 MED ORDER — VITAMIN D (ERGOCALCIFEROL) 1.25 MG (50000 UNIT) PO CAPS: 50000.0000 [IU] | ORAL_CAPSULE | ORAL | 0 refills | Status: DC

## 2023-03-27 MED ORDER — CHLORTHALIDONE 50 MG PO TABS: 50.0000 mg | ORAL_TABLET | Freq: Every day | ORAL | 0 refills | Status: DC

## 2023-03-27 NOTE — Progress Notes (Addendum)
WEIGHT SUMMARY AND BIOMETRICS  Vitals Temp: 98.2 F (36.8 C) BP: (!) 162/100 Pulse Rate: 86 SpO2: 95 %   Anthropometric Measurements Height: 5\' 3"  (1.6 m) Weight: 268 lb (121.6 kg) BMI (Calculated): 47.49 Weight at Last Visit: 270lb Weight Lost Since Last Visit: 2lb Weight Gained Since Last Visit: 0 Starting Weight: 276lb Total Weight Loss (lbs): 8 lb (3.629 kg)   Body Composition  Body Fat %: 53.2 % Fat Mass (lbs): 142.6 lbs Muscle Mass (lbs): 119.2 lbs Total Body Water (lbs): 0 lbs Visceral Fat Rating : 18   Other Clinical Data Fasting: yes Labs: no Today's Visit #: 11 Starting Date: 05/26/22    Chief Complaint:   OBESITY Marveen is here to discuss her progress with her obesity treatment plan. She is on the the Category 3 Plan and states she is following her eating plan approximately 90 % of the time. She states she is exercising Walking and Planks 20-30 minutes 3 times per week.   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 She has a stand up desk. Discussed adding a walking desk to her home office.  Hydration-she estimates to drink >64 oz water/day  Of note- She drinks the following "cocktail" dilated in 8 oz water every morning- Tumeric  Apple Cider Vinegar Cheyenne Pepper Lemon  Subjective:   1. Vitamin D deficiency  Latest Reference Range & Units 01/24/23 09:53  Vitamin D, 25-Hydroxy 30.0 - 100.0 ng/mL 37.7   Level improving, however still below goal She is on weekly Ergocalciferol- denies N/V/Muscle Weakness  2. Pre-diabetes Lab Results  Component Value Date   HGBA1C 6.0 (H) 01/24/2023   HGBA1C 6.1 (H) 09/19/2022   HGBA1C 6.2 (H) 05/26/2022    01/24/2023 CMP GFR >60 She is on daily Metformin 500mg  with breakfast  3. Primary hypertension BP above goal at OV She did NOT take morning antihypertensive therapy. She denies acute cardiac sx's She prescribed NIFEdipine (PROCARDIA XL/NIFEDICAL-XL) 90 MG 24  hr tablet  chlorthalidone (HYGROTON) 50 MG tablet   Assessment/Plan:   1. Vitamin D deficiency Refill - Vitamin D, Ergocalciferol, (DRISDOL) 1.25 MG (50000 UNIT) CAPS capsule; Take 1 capsule (50,000 Units total) by mouth every 7 (seven) days.  Dispense: 12 capsule; Refill: 0  2. Pre-diabetes Continue daily Metformin- does not require refill today  3. Primary hypertension Refill - chlorthalidone (HYGROTON) 50 MG tablet; Take 1 tablet (50 mg total) by mouth daily.  Dispense: 90 tablet; Refill: 0  Ensure to TAKE ANTIHYPERTENSIVES DAILY  4. BMI 45.0-49.9, adult (HCC),Current BMI  Dijon is currently in the action stage of change. As such, her goal is to continue with weight loss efforts. She has agreed to the Category 3 Plan.   Exercise goals: For substantial health benefits, adults should do at least 150 minutes (2 hours and 30 minutes) a week of moderate-intensity, or 75 minutes (1 hour and 15 minutes) a week of vigorous-intensity aerobic physical activity, or an equivalent combination of moderate- and vigorous-intensity aerobic activity. Aerobic activity should be performed in episodes of at least 10 minutes, and preferably, it should be spread throughout the week.  Behavioral modification strategies: increasing lean protein intake, decreasing simple carbohydrates, increasing vegetables, increasing water intake, no skipping meals, meal planning and cooking strategies, keeping healthy foods in the home, and planning for success.  Jodean has agreed to follow-up with our clinic in 4 weeks. She was informed of the importance of frequent follow-up visits to maximize her success with intensive  lifestyle modifications for her multiple health conditions.   Objective:   Blood pressure (!) 162/100, pulse 86, temperature 98.2 F (36.8 C), height 5\' 3"  (1.6 m), weight 268 lb (121.6 kg), SpO2 95%. Body mass index is 47.47 kg/m.  General: Cooperative, alert, well developed, in no acute  distress. HEENT: Conjunctivae and lids unremarkable. Cardiovascular: Regular rhythm.  Lungs: Normal work of breathing. Neurologic: No focal deficits.   Lab Results  Component Value Date   CREATININE 0.85 01/24/2023   BUN 9 01/24/2023   NA 142 01/24/2023   K 3.6 01/24/2023   CL 103 01/24/2023   CO2 24 01/24/2023   Lab Results  Component Value Date   ALT 28 01/24/2023   AST 23 01/24/2023   ALKPHOS 67 01/24/2023   BILITOT 0.2 01/24/2023   Lab Results  Component Value Date   HGBA1C 6.0 (H) 01/24/2023   HGBA1C 6.1 (H) 09/19/2022   HGBA1C 6.2 (H) 05/26/2022   HGBA1C 6.2 (H) 10/14/2019   Lab Results  Component Value Date   INSULIN 32.5 (H) 05/26/2022   Lab Results  Component Value Date   TSH 1.490 05/26/2022   Lab Results  Component Value Date   CHOL 237 (H) 05/26/2022   HDL 62 05/26/2022   LDLCALC 159 (H) 05/26/2022   TRIG 90 05/26/2022   CHOLHDL 3.8 05/26/2022   Lab Results  Component Value Date   VD25OH 37.7 01/24/2023   VD25OH 27.5 (L) 09/19/2022   VD25OH 11.7 (L) 05/26/2022   Lab Results  Component Value Date   WBC 4.7 01/24/2023   HGB 12.9 01/24/2023   HCT 38.8 01/24/2023   MCV 86 01/24/2023   PLT 322 01/24/2023   No results found for: "IRON", "TIBC", "FERRITIN"  Attestation Statements:   Reviewed by clinician on day of visit: allergies, medications, problem list, medical history, surgical history, family history, social history, and previous encounter notes.  I have reviewed the above documentation for accuracy and completeness, and I agree with the above. -  Cristabel Bicknell d. Akari Crysler, NP-C

## 2023-04-11 ENCOUNTER — Telehealth (INDEPENDENT_AMBULATORY_CARE_PROVIDER_SITE_OTHER): Payer: 59 | Admitting: Psychology

## 2023-04-11 DIAGNOSIS — F5089 Other specified eating disorder: Secondary | ICD-10-CM

## 2023-04-11 DIAGNOSIS — F331 Major depressive disorder, recurrent, moderate: Secondary | ICD-10-CM | POA: Diagnosis not present

## 2023-04-11 NOTE — Progress Notes (Signed)
  Office: [phone]  /  Fax: [phone]    Date: April 11, 2023  Appointment Start Time: 9:00am Duration: 22 minutes Provider: Lawerance Cruel, Psy.D. Type of Session: Individual Therapy  Location of Patient: Home (private location) Location of Provider: Provider's Home (private office) Type of Contact: Telepsychological Visit via MyChart Video Visit  Session Content: Gina Walker is a 42 y.o. female presenting for a follow-up appointment to address the previously established treatment goal of increasing coping skills.Today's appointment was a telepsychological visit. Gina Walker provided verbal consent for today's telepsychological appointment and she is aware she is responsible for securing confidentiality on her end of the session. Prior to proceeding with today's appointment, Gina Walker's physical location at the time of this appointment was obtained as well a phone number she could be reached at in the event of technical difficulties. Gina Walker and this provider participated in today's telepsychological service.   This provider conducted a brief check-in. Gina Walker shared, "I'm eating a whole lot better." She discussed engaging in "self-talk" has helped her increase adherence to her prescribed structured meal plan. Reviewed triggers for emotional eating behaviors. Gina Walker described some reduction in emotional and binge eating behaviors. To help with coping, psychoeducation provided regarding mindful eating strategies (sit down, slowly chew, savor, simplify, and smile). Overall, Gina Walker was receptive to today's appointment as evidenced by openness to sharing, responsiveness to feedback, and willingness to implement discussed strategies .  Mental Status Examination:  Appearance: neat Behavior: appropriate to circumstances Mood: sad Affect: mood congruent Speech: WNL Eye Contact: appropriate Psychomotor Activity: WNL Gait: unable to assess Thought Process: linear, logical, and goal directed and no evidence  or endorsement of suicidal, homicidal, and self-harm ideation, plan and intent  Thought Content/Perception: no hallucinations, delusions, bizarre thinking or behavior endorsed or observed Orientation: AAOx4 Memory/Concentration: intact Insight: fair Judgment: fair  Interventions:  Conducted a brief chart review Provided empathic reflections and validation Reviewed content from the previous session Provided positive reinforcement Employed supportive psychotherapy interventions to facilitate reduced distress and to improve coping skills with identified stressors Psychoeducation provided regarding mindful eating strategies  DSM-5 Diagnosis(es):  F50.89 Other Specified Feeding or Eating Disorder, Emotional and Binge Eating Behaviors and F33.1  Major Depressive Disorder, Recurrent Episode, Moderate  Treatment Goal & Progress: During the initial appointment with this provider, the following treatment goal was established: increase coping skills. Gina Walker has demonstrated progress in her goal as evidenced by increased awareness of hunger patterns and increased awareness of triggers for emotional eating behaviors. She discussed a reduction in emotional and binge eating behaviors. Gina Walker also continues to demonstrate willingness to engage in learned skill(s).  Plan: The next appointment is scheduled for 05/02/2023 at 8:30am, which will be via MyChart Video Visit. The next session will focus on working towards the established treatment goal. Due to a scheduling issue, Gina Walker has not initiated EAP services. She will reschedule the appointment.

## 2023-04-24 ENCOUNTER — Telehealth (INDEPENDENT_AMBULATORY_CARE_PROVIDER_SITE_OTHER): Payer: 59 | Admitting: Family Medicine

## 2023-04-24 ENCOUNTER — Encounter (INDEPENDENT_AMBULATORY_CARE_PROVIDER_SITE_OTHER): Payer: Self-pay | Admitting: Family Medicine

## 2023-04-24 ENCOUNTER — Encounter (INDEPENDENT_AMBULATORY_CARE_PROVIDER_SITE_OTHER): Payer: Self-pay

## 2023-04-24 DIAGNOSIS — E669 Obesity, unspecified: Secondary | ICD-10-CM | POA: Diagnosis not present

## 2023-04-24 DIAGNOSIS — E559 Vitamin D deficiency, unspecified: Secondary | ICD-10-CM | POA: Diagnosis not present

## 2023-04-24 DIAGNOSIS — R7303 Prediabetes: Secondary | ICD-10-CM

## 2023-04-24 DIAGNOSIS — Z6841 Body Mass Index (BMI) 40.0 and over, adult: Secondary | ICD-10-CM | POA: Diagnosis not present

## 2023-04-24 MED ORDER — VITAMIN D (ERGOCALCIFEROL) 1.25 MG (50000 UNIT) PO CAPS: 50000.0000 [IU] | ORAL_CAPSULE | ORAL | 0 refills | Status: DC

## 2023-04-24 NOTE — Progress Notes (Unsigned)
TeleHealth Visit:  Due to the COVID-19 pandemic, this visit was completed with telemedicine (audio/video) technology to reduce patient and provider exposure as well as to preserve personal protective equipment.   Gina Walker has verbally consented to this TeleHealth visit. The patient is located at home, the provider is located at the [facility] office. The participants in this visit include the listed provider and patient. The visit was conducted today via MyChart video.   Chief Complaint: OBESITY Gina Walker is here to discuss her progress with her obesity treatment plan along with follow-up of her obesity related diagnoses. Gina Walker is on the Category 3 Plan and states she is following her eating plan approximately 85% of the time. Gina Walker states she is walking, doing planks and knee highs for 10 minutes 1-2 times per week.  Today's visit was #: 12 Starting weight: 276 lbs Starting date: 05/26/2022  Interim History: Patient is trying to incorporate a few options from the snack list into her intake to decrease her intake of chips and other snack options.  She is eating more yogurt recently and protein shakes to increase her total protein intake.  She is mixing the protein shake with coffee to increase her daily. She is able to follow the meal plan around 85% of the time but may leave the bread off her food intake. Adding boiled eggs to her salad to make up for lack of meat in her salad. Over the next few weeks she doesn't have much planned except Thanksgiving.  She is planning to stay local for the holiday.  She does anticipate getting together with family. She is wondering about substituting some of the meat from the plan with beans.  Subjective:   1. Vitamin D deficiency Patient is on prescription vitamin D, and she denies nausea, vomiting, or muscle weakness but notes fatigue.  Her last vitamin D level was from the end of July.  2. Prediabetes Patient's last A1c was 6.0, and she  is not on medications.  Assessment/Plan:   1. Vitamin D deficiency We will refill vitamin D 50,000 IU once weekly for 90 days.  - Vitamin D, Ergocalciferol, (DRISDOL) 1.25 MG (50000 UNIT) CAPS capsule; Take 1 capsule (50,000 Units total) by mouth every 7 (seven) days.  Dispense: 12 capsule; Refill: 0  2. Prediabetes Patient will continue her current meal plan with no change in treatment.  We will repeat labs in January.  3. BMI 45.0-49.9, adult (HCC),Current BMI  4. Obesity with starting BMI of 49.0 Gina Walker is currently in the action stage of change. As such, her goal is to continue with weight loss efforts. She has agreed to the Category 3 Plan.   Exercise goals: As is.   Behavioral modification strategies: increasing lean protein intake, meal planning and cooking strategies, keeping healthy foods in the home, and planning for success.  Gina Walker has agreed to follow-up with our clinic in 4 weeks. She was informed of the importance of frequent follow-up visits to maximize her success with intensive lifestyle modifications for her multiple health conditions.  Objective:   VITALS: Per patient if applicable, see vitals. GENERAL: Alert and in no acute distress. CARDIOPULMONARY: No increased WOB. Speaking in clear sentences.  PSYCH: Pleasant and cooperative. Speech normal rate and rhythm. Affect is appropriate. Insight and judgement are appropriate. Attention is focused, linear, and appropriate.  NEURO: Oriented as arrived to appointment on time with no prompting.   Lab Results  Component Value Date   CREATININE 0.85 01/24/2023  BUN 9 01/24/2023   NA 142 01/24/2023   K 3.6 01/24/2023   CL 103 01/24/2023   CO2 24 01/24/2023   Lab Results  Component Value Date   ALT 28 01/24/2023   AST 23 01/24/2023   ALKPHOS 67 01/24/2023   BILITOT 0.2 01/24/2023   Lab Results  Component Value Date   HGBA1C 6.0 (H) 01/24/2023   HGBA1C 6.1 (H) 09/19/2022   HGBA1C 6.2 (H) 05/26/2022   HGBA1C  6.2 (H) 10/14/2019   Lab Results  Component Value Date   INSULIN 32.5 (H) 05/26/2022   Lab Results  Component Value Date   TSH 1.490 05/26/2022   Lab Results  Component Value Date   CHOL 237 (H) 05/26/2022   HDL 62 05/26/2022   LDLCALC 159 (H) 05/26/2022   TRIG 90 05/26/2022   CHOLHDL 3.8 05/26/2022   Lab Results  Component Value Date   VD25OH 37.7 01/24/2023   VD25OH 27.5 (L) 09/19/2022   VD25OH 11.7 (L) 05/26/2022   Lab Results  Component Value Date   WBC 4.7 01/24/2023   HGB 12.9 01/24/2023   HCT 38.8 01/24/2023   MCV 86 01/24/2023   PLT 322 01/24/2023   No results found for: "IRON", "TIBC", "FERRITIN"  Attestation Statements:   Reviewed by clinician on day of visit: allergies, medications, problem list, medical history, surgical history, family history, social history, and previous encounter notes.   I, Burt Knack, am acting as transcriptionist for Reuben Likes, MD.  I have reviewed the above documentation for accuracy and completeness, and I agree with the above. - Reuben Likes, MD

## 2023-05-02 ENCOUNTER — Telehealth (INDEPENDENT_AMBULATORY_CARE_PROVIDER_SITE_OTHER): Payer: 59 | Admitting: Psychology

## 2023-05-02 DIAGNOSIS — F5089 Other specified eating disorder: Secondary | ICD-10-CM | POA: Diagnosis not present

## 2023-05-02 DIAGNOSIS — F331 Major depressive disorder, recurrent, moderate: Secondary | ICD-10-CM | POA: Diagnosis not present

## 2023-05-02 NOTE — Progress Notes (Signed)
  Office: [phone]  /  Fax: [phone]    Date: May 02, 2023  Appointment Start Time: 8:32am Duration: 19 minutes Provider: Lawerance Cruel, Psy.D. Type of Session: Individual Therapy  Location of Patient: Home (private location) Location of Provider: Provider's Home (private office) Type of Contact: Telepsychological Visit via MyChart Video Visit  Session Content: Gina Walker is a 42 y.o. female presenting for a follow-up appointment to address the previously established treatment goal of increasing coping skills.Today's appointment was a telepsychological visit. Gina Walker provided verbal consent for today's telepsychological appointment and she is aware she is responsible for securing confidentiality on her end of the session. Prior to proceeding with today's appointment, Gina Walker's physical location at the time of this appointment was obtained as well a phone number she could be reached at in the event of technical difficulties. Gina Walker and this provider participated in today's telepsychological service.   This provider conducted a brief check-in. Gina Walker shared she has been "under the weather." Regarding her eating habits, she stated she gets "tired" of eating certain foods when she does not have a "distraction." Further explored and processed. Due to the upcoming holiday, psychoeducation regarding making better choices and engaging in portion control during the holidays/celebrations/vacations was provided. More specifically, this provider discussed the following strategies: coming to meals hungry, but not starving; avoid filling up on appetizers; managing portion sizes; not completely depriving yourself; making the plate colorful (e.g., vegetables); pacing yourself (e.g., waiting 10 minutes before going back for seconds); taking advantage of the nutritious foods; practicing mindfulness; staying hydrated; and avoid bringing home leftovers. Overall, Gina Walker was receptive to today's appointment as  evidenced by openness to sharing, responsiveness to feedback, and willingness to implement discussed strategies .  Mental Status Examination:  Appearance: neat Behavior: appropriate to circumstances Mood: neutral Affect: mood congruent Speech: WNL Eye Contact: appropriate Psychomotor Activity: WNL Gait: unable to assess Thought Process: linear, logical, and goal directed and no evidence or endorsement of suicidal, homicidal, and self-harm ideation, plan and intent  Thought Content/Perception: no hallucinations, delusions, bizarre thinking or behavior endorsed or observed Orientation: AAOx4 Memory/Concentration: intact Insight: fair Judgment: fair  Interventions:  Conducted a brief chart review Provided empathic reflections and validation Provided positive reinforcement Employed supportive psychotherapy interventions to facilitate reduced distress and to improve coping skills with identified stressors Psychoeducation provided regarding strategies for celebrations/holidays/vacations  DSM-5 Diagnosis(es):  F50.89 Other Specified Feeding or Eating Disorder, Emotional and Binge Eating Behaviors and F33.1  Major Depressive Disorder, Recurrent Episode, Moderate  Treatment Goal & Progress: During the initial appointment with this provider, the following treatment goal was established: increase coping skills. Gina Walker has demonstrated progress in her goal as evidenced by increased awareness of hunger patterns and increased awareness of triggers for emotional eating behaviors. She discussed a reduction in emotional and binge eating behaviors. Gina Walker also continues to demonstrate willingness to engage in learned skill(s).   Plan: The next appointment is scheduled for 05/30/2023 at 8:30am, which will be via MyChart Video Visit. The next session will focus on working towards the established treatment goal. Gina Walker stated she still needs to make an appointment with EAP services.

## 2023-05-18 ENCOUNTER — Ambulatory Visit (INDEPENDENT_AMBULATORY_CARE_PROVIDER_SITE_OTHER): Payer: 59 | Admitting: Adult Health

## 2023-05-18 ENCOUNTER — Encounter (INDEPENDENT_AMBULATORY_CARE_PROVIDER_SITE_OTHER): Payer: Self-pay | Admitting: Adult Health

## 2023-05-18 VITALS — BP 153/94 | HR 81 | Temp 98.0°F | Ht 63.0 in | Wt 274.0 lb

## 2023-05-18 DIAGNOSIS — E669 Obesity, unspecified: Secondary | ICD-10-CM

## 2023-05-18 DIAGNOSIS — I10 Essential (primary) hypertension: Secondary | ICD-10-CM

## 2023-05-18 DIAGNOSIS — R7303 Prediabetes: Secondary | ICD-10-CM

## 2023-05-18 DIAGNOSIS — E559 Vitamin D deficiency, unspecified: Secondary | ICD-10-CM

## 2023-05-18 DIAGNOSIS — Z6841 Body Mass Index (BMI) 40.0 and over, adult: Secondary | ICD-10-CM

## 2023-05-18 MED ORDER — CHLORTHALIDONE 50 MG PO TABS: 50.0000 mg | ORAL_TABLET | Freq: Every day | ORAL | 0 refills | Status: DC

## 2023-05-18 MED ORDER — VITAMIN D (ERGOCALCIFEROL) 1.25 MG (50000 UNIT) PO CAPS: 50000.0000 [IU] | ORAL_CAPSULE | ORAL | 0 refills | Status: DC

## 2023-05-18 MED ORDER — METFORMIN HCL 500 MG PO TABS: 500.0000 mg | ORAL_TABLET | Freq: Every day | ORAL | 0 refills | Status: DC

## 2023-05-18 MED ORDER — NIFEDIPINE ER OSMOTIC RELEASE 90 MG PO TB24: 90.0000 mg | ORAL_TABLET | Freq: Every day | ORAL | 0 refills | Status: DC
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
## 2023-05-24 ENCOUNTER — Ambulatory Visit (INDEPENDENT_AMBULATORY_CARE_PROVIDER_SITE_OTHER): Payer: 59 | Admitting: Family Medicine

## 2023-05-30 ENCOUNTER — Telehealth (INDEPENDENT_AMBULATORY_CARE_PROVIDER_SITE_OTHER): Payer: 59 | Admitting: Psychology

## 2023-05-30 DIAGNOSIS — F331 Major depressive disorder, recurrent, moderate: Secondary | ICD-10-CM | POA: Diagnosis not present

## 2023-05-30 DIAGNOSIS — F5089 Other specified eating disorder: Secondary | ICD-10-CM

## 2023-05-30 NOTE — Progress Notes (Signed)
  Office: [phone]  /  Fax: [phone]    Date: May 30, 2023  Appointment Start Time: 8:32am Duration: 22 minutes Provider: Lawerance Cruel, Psy.D. Type of Session: Individual Therapy  Location of Patient: Home (private location) Location of Provider: Provider's Home (private office) Type of Contact: Telepsychological Visit via MyChart Video Visit  Session Content: Gina Walker is a 42 y.o. female presenting for a follow-up appointment to address the previously established treatment goal of increasing coping skills.Today's appointment was a telepsychological visit. Gina Walker provided verbal consent for today's telepsychological appointment and she is aware she is responsible for securing confidentiality on her end of the session. Prior to proceeding with today's appointment, Gina Walker's physical location at the time of this appointment was obtained as well a phone number she could be reached at in the event of technical difficulties. Gina Walker and this provider participated in today's telepsychological service.   This provider conducted a brief check-in. [name] shared she is more "mindful" about her eating habits, noting, "It's really helped." Discussed what went well with eating habits during Thanksgiving and what she can do differently during future holidays. Briefly reviewed previously discussed strategies. Additionally, reflected on progress to date, including implementation of learned skills. To further assist with coping, psychoeducation provided regarding the S.W.A.P. technique (say, wait, addressing the feeling not the food, pursue another activity). Furthermore, termination planning was discussed. Gina Walker was receptive to a follow-up appointment in 4-5 weeks and an additional follow-up/termination appointment in 4-5 weeks after that. Overall, Gina Walker was receptive to today's appointment as evidenced by openness to sharing, responsiveness to feedback, and willingness to implement discussed strategies  .  Mental Status Examination:  Appearance: neat Behavior: appropriate to circumstances Mood: neutral Affect: mood congruent Speech: WNL Eye Contact: appropriate Psychomotor Activity: WNL Gait: unable to assess Thought Process: linear, logical, and goal directed and no evidence or endorsement of suicidal, homicidal, and self-harm ideation, plan and intent  Thought Content/Perception: no hallucinations, delusions, bizarre thinking or behavior endorsed or observed Orientation: AAOx4 Memory/Concentration: intact Insight: good Judgment: good  Interventions:  Conducted a brief chart review Provided empathic reflections and validation Reviewed content from the previous session Provided positive reinforcement Employed supportive psychotherapy interventions to facilitate reduced distress and to improve coping skills with identified stressors Discussed termination planning Recommended/discussed options for longer-term therapeutic services Reviewed learned skills  DSM-5 Diagnosis(es):  F50.89 Other Specified Feeding or Eating Disorder, Emotional and Binge Eating Behaviors and F33.1  Major Depressive Disorder, Recurrent Episode, Moderate  Treatment Goal & Progress: During the initial appointment with this provider, the following treatment goal was established: increase coping skills. Gina Walker has demonstrated progress in her goal as evidenced by increased awareness of hunger patterns and increased awareness of triggers for emotional eating behaviors. She discussed a reduction in emotional and binge eating behaviors. Gina Walker also continues to demonstrate willingness to engage in learned skill(s).   Plan: The next appointment is scheduled for 07/04/2023 at 8:30am, which will be via MyChart Video Visit. The next session will focus on working towards the established treatment goal. Gina Walker stated she still plans to establish care via EAP services.

## 2023-06-15 ENCOUNTER — Ambulatory Visit (INDEPENDENT_AMBULATORY_CARE_PROVIDER_SITE_OTHER): Payer: 59 | Admitting: Family Medicine

## 2023-06-15 ENCOUNTER — Encounter (INDEPENDENT_AMBULATORY_CARE_PROVIDER_SITE_OTHER): Payer: Self-pay | Admitting: Family Medicine

## 2023-06-15 VITALS — BP 128/86 | HR 75 | Temp 98.4°F | Ht 63.0 in | Wt 266.0 lb

## 2023-06-15 DIAGNOSIS — E785 Hyperlipidemia, unspecified: Secondary | ICD-10-CM | POA: Diagnosis not present

## 2023-06-15 DIAGNOSIS — E7849 Other hyperlipidemia: Secondary | ICD-10-CM

## 2023-06-15 DIAGNOSIS — Z6841 Body Mass Index (BMI) 40.0 and over, adult: Secondary | ICD-10-CM

## 2023-06-15 DIAGNOSIS — R7303 Prediabetes: Secondary | ICD-10-CM | POA: Diagnosis not present

## 2023-06-15 DIAGNOSIS — E559 Vitamin D deficiency, unspecified: Secondary | ICD-10-CM

## 2023-06-15 DIAGNOSIS — I10 Essential (primary) hypertension: Secondary | ICD-10-CM

## 2023-06-15 MED ORDER — LOSARTAN POTASSIUM 25 MG PO TABS: 25.0000 mg | ORAL_TABLET | Freq: Every day | ORAL | 0 refills | Status: DC

## 2023-06-15 NOTE — Assessment & Plan Note (Signed)
Discussed importance of vitamin d supplementation.  Vitamin d supplementation has been shown to decrease fatigue, decrease risk of progression to insulin resistance and then prediabetes, decreases risk of falling in older age and can even assist in decreasing depressive symptoms in PTSD.   Vitamin D level today.

## 2023-06-15 NOTE — Assessment & Plan Note (Signed)
Last LDL elevated at 159. She is on lipitor daily.   The 10-year ASCVD risk score (Arnett DK, et al., 2019) is: 4.2%   Values used to calculate the score:     Age: 42 years     Sex: Female     Is Non-Hispanic African American: Yes     Diabetic: Yes     Tobacco smoker: No     Systolic Blood Pressure: 128 mmHg     Is BP treated: Yes     HDL Cholesterol: 62 mg/dL     Total Cholesterol: 237 mg/dL  FLP today.

## 2023-06-15 NOTE — Assessment & Plan Note (Signed)
Last A1c of 6.0.  She is not on medication.  Some increased drive for snacking but she is getting more mindful.

## 2023-06-15 NOTE — Assessment & Plan Note (Signed)
Patient brings in BP log today.  Systolic ranges from 121-168 and diastolic from 83 to 110.  She is experiencing headaches now from BP elevations.  She is on procardia daily at max dose, chlorthalidone 50mg  daily and BP still elevated.  Will add losartan 25mg  daily with likelihood to increase to 50mg  for more control.  CMP today.

## 2023-06-15 NOTE — Progress Notes (Signed)
SUBJECTIVE:  Chief Complaint: Obesity  Interim History: Patient has been working on eating more protein and being more mindful of the food she is eating and how she is eating it.  She brought a BP log in today.  She stopped doing some mindless eating like eating while watching tv.  She has been aware that often she didn't realize she was even eating when watching tv.  She was mindful of food choices and quantity during Thanksgiving as well. Next few weeks she is doing a family dinner for Christmas and then going to church for New Years.   Ares is here to discuss her progress with her obesity treatment plan. She is on the Category 4 Plan and states she is following her eating plan approximately 80 % of the time. She states she is not exercising.  OBJECTIVE: Visit Diagnoses: Problem List Items Addressed This Visit       Cardiovascular and Mediastinum   Primary hypertension - Primary   Patient brings in BP log today.  Systolic ranges from 121-168 and diastolic from 83 to 110.  She is experiencing headaches now from BP elevations.  She is on procardia daily at max dose, chlorthalidone 50mg  daily and BP still elevated.  Will add losartan 25mg  daily with likelihood to increase to 50mg  for more control.  CMP today.      Relevant Medications   losartan (COZAAR) 25 MG tablet   Other Relevant Orders   Comprehensive metabolic panel     Other   Hyperlipidemia   Last LDL elevated at 159. She is on lipitor daily.   The 10-year ASCVD risk score (Arnett DK, et al., 2019) is: 4.2%   Values used to calculate the score:     Age: 42 years     Sex: Female     Is Non-Hispanic African American: Yes     Diabetic: Yes     Tobacco smoker: No     Systolic Blood Pressure: 128 mmHg     Is BP treated: Yes     HDL Cholesterol: 62 mg/dL     Total Cholesterol: 237 mg/dL  FLP today.      Relevant Medications   losartan (COZAAR) 25 MG tablet   Other Relevant Orders   Lipid Panel With LDL/HDL Ratio    Pre-diabetes   Last A1c of 6.0.  She is not on medication.  Some increased drive for snacking but she is getting more mindful.      Vitamin D deficiency   Discussed importance of vitamin d supplementation.  Vitamin d supplementation has been shown to decrease fatigue, decrease risk of progression to insulin resistance and then prediabetes, decreases risk of falling in older age and can even assist in decreasing depressive symptoms in PTSD.   Vitamin D level today.       Relevant Orders   VITAMIN D 25 Hydroxy (Vit-D Deficiency, Fractures)   Other Visit Diagnoses       Prediabetes       Relevant Orders   Hemoglobin A1c   Insulin, random       Vitals Temp: 98.4 F (36.9 C) BP: 128/86 Pulse Rate: 75 SpO2: 100 %   Anthropometric Measurements Height: 5\' 3"  (1.6 m) Weight: 266 lb (120.7 kg) BMI (Calculated): 47.13 Weight at Last Visit: 274 lb Weight Lost Since Last Visit: 8 lb Weight Gained Since Last Visit: 0 Starting Weight: 276 lb Total Weight Loss (lbs): 10 lb (4.536 kg)   Body Composition  Body Fat %:  52.2 % Fat Mass (lbs): 139.2 lbs Muscle Mass (lbs): 121.2 lbs Total Body Water (lbs): 99.4 lbs Visceral Fat Rating : 17   Other Clinical Data Fasting: yes Labs: yes Today's Visit #: 13 Starting Date: 05/26/22     ASSESSMENT AND PLAN:  Diet: Arlett is currently in the action stage of change. As such, her goal is to continue with weight loss efforts. She has agreed to Category 4 Plan.  Exercise: Lysette has been instructed that some exercise is better than none for weight loss and overall health benefits.   Behavior Modification:  We discussed the following Behavioral Modification Strategies today: increasing lean protein intake, increasing vegetables, better snacking choices, and emotional eating strategies .   No follow-ups on file.Marland Kitchen She was informed of the importance of frequent follow up visits to maximize her success with intensive lifestyle  modifications for her multiple health conditions.  Attestation Statements:   Reviewed by clinician on day of visit: allergies, medications, problem list, medical history, surgical history, family history, social history, and previous encounter notes.      Reuben Likes, MD

## 2023-06-17 LAB — COMPREHENSIVE METABOLIC PANEL
ALT: 32 [IU]/L (ref 0–32)
AST: 19 [IU]/L (ref 0–40)
Albumin: 4.5 g/dL (ref 3.9–4.9)
Alkaline Phosphatase: 112 [IU]/L (ref 44–121)
BUN/Creatinine Ratio: 16 (ref 9–23)
BUN: 13 mg/dL (ref 6–24)
Bilirubin Total: 0.7 mg/dL (ref 0.0–1.2)
CO2: 23 mmol/L (ref 20–29)
Calcium: 9.6 mg/dL (ref 8.7–10.2)
Chloride: 103 mmol/L (ref 96–106)
Creatinine, Ser: 0.83 mg/dL (ref 0.57–1.00)
Globulin, Total: 2.4 g/dL (ref 1.5–4.5)
Glucose: 89 mg/dL (ref 70–99)
Potassium: 4.5 mmol/L (ref 3.5–5.2)
Sodium: 146 mmol/L — ABNORMAL HIGH (ref 134–144)
Total Protein: 6.9 g/dL (ref 6.0–8.5)
eGFR: 90 mL/min/{1.73_m2} (ref >59)

## 2023-06-17 LAB — LIPID PANEL WITH LDL/HDL RATIO
Cholesterol, Total: 151 mg/dL (ref 100–199)
HDL: 52 mg/dL (ref >39)
LDL Chol Calc (NIH): 82 mg/dL (ref 0–99)
LDL/HDL Ratio: 1.6 {ratio} (ref 0.0–3.2)
Triglycerides: 88 mg/dL (ref 0–149)
VLDL Cholesterol Cal: 17 mg/dL (ref 5–40)

## 2023-06-17 LAB — HEMOGLOBIN A1C
Est. average glucose Bld gHb Est-mCnc: 131 mg/dL
Hgb A1c MFr Bld: 6.2 % — ABNORMAL HIGH (ref 4.8–5.6)

## 2023-06-17 LAB — INSULIN, RANDOM: INSULIN: 12.7 u[IU]/mL (ref 2.6–24.9)

## 2023-06-17 LAB — VITAMIN D 25 HYDROXY (VIT D DEFICIENCY, FRACTURES): Vit D, 25-Hydroxy: 68.3 ng/mL (ref 30.0–100.0)

## 2023-07-04 ENCOUNTER — Telehealth (INDEPENDENT_AMBULATORY_CARE_PROVIDER_SITE_OTHER): Payer: 59 | Admitting: Psychology

## 2023-07-04 ENCOUNTER — Encounter (INDEPENDENT_AMBULATORY_CARE_PROVIDER_SITE_OTHER): Payer: Self-pay

## 2023-07-04 ENCOUNTER — Telehealth (INDEPENDENT_AMBULATORY_CARE_PROVIDER_SITE_OTHER): Payer: Self-pay | Admitting: Psychology

## 2023-07-04 NOTE — Telephone Encounter (Signed)
  Office: [phone]  /  Fax: [phone]  Date of Call: July 04, 2023  Time of Call: 8:32am Provider: Wyatt Fire, PsyD  CONTENT: This provider called Alfonso to check-in as she did not present for today's MyChart Video Visit appointment. A HIPAA compliant voicemail was left requesting a call back. Of note, this provider stayed on the MyChart Video Visit appointment for 5 minutes prior to signing off per the clinic's grace period policy.    PLAN: This provider will wait for Shaniah to call back. No further follow-up planned by this provider.

## 2023-07-04 NOTE — Telephone Encounter (Signed)
 I was notified, by Dr. Sharron, this patient arrived 25 minutes late for appointment. Attempted to contact patient to notify that she will need to reschedule today's appointment with Dr. Sharron. There was no answer to phone call so I left a voicemail message for this patient to call us  to reschedule today's appointment. Will also send patient a my chart message.

## 2023-07-04 NOTE — Progress Notes (Unsigned)
  Office: [phone]  /  Fax: [phone]    Date: July 04, 2023  Appointment Start Time: *** Duration: *** minutes Provider: Wyatt Fire, Psy.D. Type of Session: Individual Therapy  Location of Patient: {gbptloc:23249} (private location) Location of Provider: Provider's Home (private office) Type of Contact: Telepsychological Visit via MyChart Video Visit  Session Content:This provider called Alfonso at 8:32am as she did not present for today's appointment. A HIPAA compliant voicemail was left requesting a call back. As such, today's appointment was initiated *** minutes late. Genni is a 43 y.o. female presenting for a follow-up appointment to address the previously established treatment goal of increasing coping skills.Today's appointment was a telepsychological visit. Chayna provided verbal consent for today's telepsychological appointment and she is aware she is responsible for securing confidentiality on her end of the session. Prior to proceeding with today's appointment, Keaisha's physical location at the time of this appointment was obtained as well a phone number she could be reached at in the event of technical difficulties. Kamry and this provider participated in today's telepsychological service.   This provider conducted a brief check-in. *** Adylin was receptive to today's appointment as evidenced by openness to sharing, responsiveness to feedback, and {gbreceptiveness:23401}.  Mental Status Examination:  Appearance: {Appearance:22431} Behavior: {Behavior:22445} Mood: {gbmood:21757} Affect: {Affect:22436} Speech: {Speech:22432} Eye Contact: {Eye Contact:22433} Psychomotor Activity: {Motor Activity:22434} Gait: {gbgait:23404} Thought Process: {thought process:22448}  Thought Content/Perception: {disturbances:22451} Orientation: {Orientation:22437} Memory/Concentration: {gbcognition:22449} Insight: {Insight:22446} Judgment: {Insight:22446}  Interventions:   {Interventions for Progress Notes:23405}  DSM-5 Diagnosis(es):  F50.89 Other Specified Feeding or Eating Disorder, Emotional and Binge Eating Behaviors and F33.1  Major Depressive Disorder, Recurrent Episode, Moderate  Treatment Goal & Progress: During the initial appointment with this provider, the following treatment goal was established: increase coping skills. Sheralee has demonstrated progress in her goal as evidenced by {gbtxprogress:22839}. Kellsie also {gbtxprogress2:22951}.  Plan: The next appointment is scheduled for *** at ***, which will be via MyChart Video Visit. The next session will focus on {Plan for Next Appointment:23400}.   Wyatt Fire, PsyD

## 2023-07-26 ENCOUNTER — Ambulatory Visit (INDEPENDENT_AMBULATORY_CARE_PROVIDER_SITE_OTHER): Payer: 59 | Admitting: Family Medicine

## 2023-07-26 ENCOUNTER — Encounter (INDEPENDENT_AMBULATORY_CARE_PROVIDER_SITE_OTHER): Payer: Self-pay | Admitting: Family Medicine

## 2023-07-26 VITALS — BP 125/80 | HR 99 | Temp 98.0°F | Ht 63.0 in | Wt 273.0 lb

## 2023-07-26 DIAGNOSIS — S39012A Strain of muscle, fascia and tendon of lower back, initial encounter: Secondary | ICD-10-CM | POA: Diagnosis not present

## 2023-07-26 DIAGNOSIS — E559 Vitamin D deficiency, unspecified: Secondary | ICD-10-CM

## 2023-07-26 DIAGNOSIS — G4733 Obstructive sleep apnea (adult) (pediatric): Secondary | ICD-10-CM | POA: Diagnosis not present

## 2023-07-26 DIAGNOSIS — Z6841 Body Mass Index (BMI) 40.0 and over, adult: Secondary | ICD-10-CM

## 2023-07-26 MED ORDER — VITAMIN D (ERGOCALCIFEROL) 1.25 MG (50000 UNIT) PO CAPS: 50000.0000 [IU] | ORAL_CAPSULE | ORAL | Status: DC

## 2023-07-26 NOTE — Assessment & Plan Note (Signed)
Vitamin D level from last appointment discussed today.  She is at goal.  Can switch RX to every other week supplementation.

## 2023-07-26 NOTE — Progress Notes (Signed)
SUBJECTIVE:  Chief Complaint: Obesity  Interim History: Patient hurt her back about 10 days ago after sneezing a few times in a row.  She has been taking tylenol and taking muscle relaxers.  Patient spent time with family over the holidays. Over the holidays she did increase her soda consumption.  She is dealing with high call volume at work and was trying to stay awake.  She has gotten more vegetables recently and is eating the vegetables raw.  She has been eating a [place] spaghetti dish that she made.    Gina Walker is here to discuss her progress with her obesity treatment plan. She is on the Category 3 Plan and states she is following her eating plan approximately 80-85 % of the time. She states she is not exercising consistently.   OBJECTIVE: Visit Diagnoses: Problem List Items Addressed This Visit       Respiratory   OSA on CPAP   Patient has a history of OSA and uses CPAP for her treatment.  We discussed new FDA approval for Zepbound for treatment of obstructive sleep apnea.  However there is no history of her sleep study in epic.  We discussed possibly finding that sleep study and seeing if patient could qualify for Zepbound if she was interested.        Musculoskeletal and Integument   Back strain   Unfortunately patient has another back strain.  She voices that she has had intermittent issues with straining her back throughout the years.  She is doing gentle stretching, light physical activity in the form of walking.  She knows that the longer she stays sedentary and inactive the worst the pain will be.  Will follow-up at next appointment to see if symptoms have resolved.        Other   Morbid obesity (HCC) with starting BMI 48.9 (Chronic)   Starting weight of 276 pounds on May 26, 2022.  Patient is currently at 273 pounds.  She is working on dietary changes.  She is interested in exploring if insurance will cover incretin therapy given her diagnosis of sleep apnea.       Vitamin D deficiency - Primary   Vitamin D level from last appointment discussed today.  She is at goal.  Can switch RX to every other week supplementation.      Relevant Medications   Vitamin D, Ergocalciferol, (DRISDOL) 1.25 MG (50000 UNIT) CAPS capsule   Other Visit Diagnoses       BMI 45.0-49.9, adult (HCC),Current BMI 48.55             07/26/2023    8:00 AM 06/15/2023    9:00 AM 05/18/2023    9:00 AM  Vitals with BMI  Height 5\' 3"  5\' 3"    Weight 273 lbs 266 lbs   BMI 48.37 47.13   Systolic 125 128 782  Diastolic 80 86 94  Pulse 99 75     No data recorded  No data recorded  No data recorded  No data recorded    ASSESSMENT AND PLAN:  Diet: Kirsten is currently in the action stage of change. As such, her goal is to continue with weight loss efforts. She has agreed to Category 3 Plan.  Exercise: Jaclyne has been instructed that some exercise is better than none for weight loss and overall health benefits.   Behavior Modification:  We discussed the following Behavioral Modification Strategies today: increasing lean protein intake, increasing vegetables, meal planning and cooking strategies, keeping healthy  foods in the home, and better snacking choices.   No follow-ups on file.Marland Kitchen She was informed of the importance of frequent follow up visits to maximize her success with intensive lifestyle modifications for her multiple health conditions.  Attestation Statements:   Reviewed by clinician on day of visit: allergies, medications, problem list, medical history, surgical history, family history, social history, and previous encounter notes.     Reuben Likes, MD

## 2023-08-01 DIAGNOSIS — S39012A Strain of muscle, fascia and tendon of lower back, initial encounter: Secondary | ICD-10-CM | POA: Insufficient documentation

## 2023-08-01 NOTE — Assessment & Plan Note (Signed)
 Patient has a history of OSA and uses CPAP for her treatment.  We discussed new FDA approval for Zepbound for treatment of obstructive sleep apnea.  However there is no history of her sleep study in epic.  We discussed possibly finding that sleep study and seeing if patient could qualify for Zepbound if she was interested.

## 2023-08-01 NOTE — Assessment & Plan Note (Signed)
 Unfortunately patient has another back strain.  She voices that she has had intermittent issues with straining her back throughout the years.  She is doing gentle stretching, light physical activity in the form of walking.  She knows that the longer she stays sedentary and inactive the worst the pain will be.  Will follow-up at next appointment to see if symptoms have resolved.

## 2023-08-01 NOTE — Assessment & Plan Note (Signed)
Starting weight of 276 pounds on May 26, 2022.  Patient is currently at 273 pounds.  She is working on dietary changes.  She is interested in exploring if insurance will cover incretin therapy given her diagnosis of sleep apnea.

## 2023-08-15 ENCOUNTER — Encounter (INDEPENDENT_AMBULATORY_CARE_PROVIDER_SITE_OTHER): Payer: Self-pay

## 2023-08-16 ENCOUNTER — Telehealth (INDEPENDENT_AMBULATORY_CARE_PROVIDER_SITE_OTHER): Payer: 59 | Admitting: Adult Health

## 2023-08-16 ENCOUNTER — Telehealth (INDEPENDENT_AMBULATORY_CARE_PROVIDER_SITE_OTHER): Payer: Self-pay

## 2023-08-16 ENCOUNTER — Encounter (INDEPENDENT_AMBULATORY_CARE_PROVIDER_SITE_OTHER): Payer: Self-pay | Admitting: Adult Health

## 2023-08-16 DIAGNOSIS — J069 Acute upper respiratory infection, unspecified: Secondary | ICD-10-CM | POA: Diagnosis not present

## 2023-08-16 DIAGNOSIS — R7303 Prediabetes: Secondary | ICD-10-CM

## 2023-08-16 DIAGNOSIS — I10 Essential (primary) hypertension: Secondary | ICD-10-CM

## 2023-08-16 DIAGNOSIS — E669 Obesity, unspecified: Secondary | ICD-10-CM

## 2023-08-16 DIAGNOSIS — Z6841 Body Mass Index (BMI) 40.0 and over, adult: Secondary | ICD-10-CM

## 2023-08-16 MED ORDER — LOSARTAN POTASSIUM 25 MG PO TABS: 25.0000 mg | ORAL_TABLET | Freq: Every day | ORAL | 0 refills | Status: DC

## 2023-08-16 MED ORDER — NIFEDIPINE ER OSMOTIC RELEASE 90 MG PO TB24: 90.0000 mg | ORAL_TABLET | Freq: Every day | ORAL | 0 refills | Status: DC

## 2023-08-16 NOTE — Telephone Encounter (Signed)
 Left a message for patient to return my call so I can go over screening questions before her appointment at 9.

## 2023-08-16 NOTE — Progress Notes (Signed)
 WEIGHT SUMMARY AND BIOMETRICS  Vitals Temp: 0 F (-17.8 C) (video) BP: 130/86 (video) Pulse Rate: 78 (video) SpO2: 0 % (video)   Anthropometric Measurements Height: 5\' 3"  (1.6 m) Weight: 276 lb (125.2 kg) (video) BMI (Calculated): 48.9 Weight at Last Visit: 273lb Weight Lost Since Last Visit: -- (video) Weight Gained Since Last Visit: -- (video) Starting Weight: 276lb Total Weight Loss (lbs):  (0 kg) (video)   Body Composition  Body Fat %: 0 % (video) Fat Mass (lbs): 0 lbs (video) Muscle Mass (lbs): 0 lbs (video) Total Body Water (lbs): 0 lbs (video) Visceral Fat Rating : 0 (video)   Other Clinical Data Fasting: no Labs: no Today's Visit #: 15 Starting Date: 05/26/22 Comments: video visit    Chief Complaint:  I connected with  Gina Walker on 08/16/23 by a video and audio enabled telemedicine application and verified that I am speaking with the correct person using two identifiers.  Patient Location: Home  Provider Location: Office/Clinic  I discussed the limitations of evaluation and management by telemedicine. The patient expressed understanding and agreed to proceed.  OBESITY Gina Walker is here to discuss her progress with her obesity treatment plan.  She is on the the Category 3 Plan and states she is following her eating plan approximately 70 % of the time.  She states she is exercising: None- Acutely ill  Interim History:  08/02/2023 Gina Walker developed: Pharyngitis, fever (highest temp 99.9 oral), body aches, nasal congestion, productive cough (clear-white mucus), and fatigue. She has not tested for Influenza or COVID-19. She reports that her aunt tested + for Influenza on 07/30/23-Gina Walker reports having lunch with her aunt 2 days prior to her + test.  She has been resting, increasing fluids, and OTC Alkseltzer and Acetaminophen,  She is currently experiencing nasal congestion, fatigue, and productive cough (clear-white mucus, with a  streaks of blood). She denies current dyspnea or fever. She has hx of mild persistent asthma and OSA. She denies tobacco/vape use She has been using daily Symbicort and PRN Albuterol.  Subjective:   1. Upper respiratory tract infection, unspecified type 08/02/2023 Gina Walker developed: Pharyngitis, fever (highest temp 99.9 oral), body aches, nasal congestion, productive cough (clear-white mucus), and fatigue. She has not tested for Influenza or COVID-19. She reports that her aunt tested + for Influenza on 07/30/23-Gina Walker reports having lunch with her aunt 2 days prior to her + test.  She has been resting, increasing fluids, and OTC Alkseltzer and Acetaminophen,  She is currently experiencing nasal congestion, fatigue, and productive cough (clear-white mucus, with a streaks of blood). She denies current dyspnea or fever. She has hx of mild persistent asthma and OSA. She denies tobacco/vape use She has been using daily Symbicort and PRN Albuterol.  2. Prediabetes Lab Results  Component Value Date   HGBA1C 6.2 (H) 06/15/2023   HGBA1C 6.0 (H) 01/24/2023   HGBA1C 6.1 (H) 09/19/2022    She has been taking Metformin 500mg  BID, EPIC review indicates that Metformin 500mg  has been ordered as every day only. She denies GI upset with BID Metformin 500mg   3. Primary hypertension Home reading today 130/86, HR 73 She denies CP or palpitations. She is currently on: chlorthalidone (HYGROTON) 50 MG tablet  losartan (COZAAR) 25 MG tablet  NIFEdipine (PROCARDIA XL/NIFEDICAL-XL) 90 MG 24 hr tablet   Assessment/Plan:   1. Upper respiratory tract infection, unspecified type Continue to rest, push fluids, and use inhalers are directed. Discussed Red Flag sx's and  if any develop seek immediate medical assistance.  2. Prediabetes Continue Metformin 500mg  once daily, not BID She does not need refill today  3. Primary hypertension Refill - losartan (COZAAR) 25 MG tablet; Take 1 tablet  (25 mg total) by mouth daily.  Dispense: 90 tablet; Refill: 0 - NIFEdipine (PROCARDIA XL/NIFEDICAL-XL) 90 MG 24 hr tablet; Take 1 tablet (90 mg total) by mouth daily.  Dispense: 90 tablet; Refill: 0  4. BMI 45.0-49.9, adult (HCC),Current BMI 48.55  Gina Walker is currently in the action stage of change. As such, her goal is to continue with weight loss efforts. She has agreed to the Category 3 Plan.   Exercise goals: No exercise has been prescribed at this time.  Behavioral modification strategies: increasing lean protein intake, decreasing simple carbohydrates, increasing vegetables, increasing water intake, no skipping meals, meal planning and cooking strategies, keeping healthy foods in the home, ways to avoid boredom eating, and planning for success.  Gina Walker has agreed to follow-up with our clinic in 4 weeks. She was informed of the importance of frequent follow-up visits to maximize her success with intensive lifestyle modifications for her multiple health conditions.   Objective:   Blood pressure 130/86, pulse 78, height 5\' 3"  (1.6 m), weight 276 lb (125.2 kg). Body mass index is 48.89 kg/m.  General: Cooperative, alert, well developed, in no acute distress. HEENT: Conjunctivae and lids unremarkable. Cardiovascular: Regular rhythm.  Lungs: Normal work of breathing. Neurologic: No focal deficits.   Lab Results  Component Value Date   CREATININE 0.83 06/15/2023   BUN 13 06/15/2023   NA 146 (H) 06/15/2023   K 4.5 06/15/2023   CL 103 06/15/2023   CO2 23 06/15/2023   Lab Results  Component Value Date   ALT 32 06/15/2023   AST 19 06/15/2023   ALKPHOS 112 06/15/2023   BILITOT 0.7 06/15/2023   Lab Results  Component Value Date   HGBA1C 6.2 (H) 06/15/2023   HGBA1C 6.0 (H) 01/24/2023   HGBA1C 6.1 (H) 09/19/2022   HGBA1C 6.2 (H) 05/26/2022   HGBA1C 6.2 (H) 10/14/2019   Lab Results  Component Value Date   INSULIN 12.7 06/15/2023   INSULIN 32.5 (H) 05/26/2022   Lab Results   Component Value Date   TSH 1.490 05/26/2022   Lab Results  Component Value Date   CHOL 151 06/15/2023   HDL 52 06/15/2023   LDLCALC 82 06/15/2023   TRIG 88 06/15/2023   CHOLHDL 3.8 05/26/2022   Lab Results  Component Value Date   VD25OH 68.3 06/15/2023   VD25OH 37.7 01/24/2023   VD25OH 27.5 (L) 09/19/2022   Lab Results  Component Value Date   WBC 4.7 01/24/2023   HGB 12.9 01/24/2023   HCT 38.8 01/24/2023   MCV 86 01/24/2023   PLT 322 01/24/2023   No results found for: "IRON", "TIBC", "FERRITIN"  Attestation Statements:   Reviewed by clinician on day of visit: allergies, medications, problem list, medical history, surgical history, family history, social history, and previous encounter notes.  I have reviewed the above documentation for accuracy and completeness, and I agree with the above. -  Elvan Ebron d. Beckey Polkowski, NP-C

## 2023-08-30 ENCOUNTER — Other Ambulatory Visit (INDEPENDENT_AMBULATORY_CARE_PROVIDER_SITE_OTHER): Payer: Self-pay | Admitting: Adult Health

## 2023-08-30 DIAGNOSIS — E559 Vitamin D deficiency, unspecified: Secondary | ICD-10-CM

## 2023-09-06 ENCOUNTER — Encounter (INDEPENDENT_AMBULATORY_CARE_PROVIDER_SITE_OTHER): Payer: Self-pay | Admitting: Family Medicine

## 2023-09-06 ENCOUNTER — Ambulatory Visit (INDEPENDENT_AMBULATORY_CARE_PROVIDER_SITE_OTHER): Payer: 59 | Admitting: Family Medicine

## 2023-09-06 VITALS — BP 121/63 | HR 73 | Temp 98.6°F | Ht 63.0 in | Wt 270.0 lb

## 2023-09-06 DIAGNOSIS — I10 Essential (primary) hypertension: Secondary | ICD-10-CM

## 2023-09-06 DIAGNOSIS — Z6841 Body Mass Index (BMI) 40.0 and over, adult: Secondary | ICD-10-CM | POA: Diagnosis not present

## 2023-09-06 DIAGNOSIS — R7303 Prediabetes: Secondary | ICD-10-CM

## 2023-09-06 NOTE — Assessment & Plan Note (Signed)
 Last A1c still in prediabetic range.  She is on metformin with no GI side effects.  Does not need a refill of metformin today.  Will continue metformin and follow up in April on labs.

## 2023-09-06 NOTE — Progress Notes (Signed)
 SUBJECTIVE:  Chief Complaint: Obesity  Interim History: Patient has been doing more line dancing recently in anticipation of an upcoming wedding (May 27th).  She has been skipping breakfast not every morning but at least 2x a week.  She is mostly eating yogurt and fruit.  She has been eating salads at lunch but hasn't been eating much in terms of protein because of cost.  She has been gravitating toward soups [employer]) to make things more cost effective.  She is eating more stir fry to get in more meat protein.   Erial is here to discuss her progress with her obesity treatment plan. She is on the Category 3 Plan and states she is following her eating plan approximately 60 % of the time. She states she is exercising 60 minutes 1-2 times per week.   OBJECTIVE: Visit Diagnoses: Problem List Items Addressed This Visit       Cardiovascular and Mediastinum   Primary hypertension - Primary   Blood pressure well controlled today.  No chest pain, chest pressure or headache.  Doing well on chlorthalidone, cozaar, procardia.  Follow up on BP at next appointments.        Other   Morbid obesity (HCC) with starting BMI 48.9 (Chronic)   See anthropometric data.  Patient to get back to weighing and measuring protein for meals on Cat 3.      Pre-diabetes   Last A1c still in prediabetic range.  She is on metformin with no GI side effects.  Does not need a refill of metformin today.  Will continue metformin and follow up in April on labs.       Vitals Temp: 98.6 F (37 C) BP: 121/63 Pulse Rate: 73 SpO2: 100 %   Anthropometric Measurements Height: 5\' 3"  (1.6 m) Weight: 270 lb (122.5 kg) BMI (Calculated): 47.84 Weight at Last Visit: 273 lb Weight Lost Since Last Visit: 3 lb Weight Gained Since Last Visit: 0 Starting Weight: 276 lb Total Weight Loss (lbs): 6 lb (2.722 kg)   Body Composition  Body Fat %: 52.3 % Fat Mass (lbs): 141.2 lbs Muscle Mass (lbs): 122.4 lbs Total Body  Water (lbs): 97.4 lbs Visceral Fat Rating : 18   Other Clinical Data Fasting: no Labs: no Today's Visit #: 16 Starting Date: 05/26/22     ASSESSMENT AND PLAN:  Diet: Chelsei is currently in the action stage of change. As such, her goal is to continue with weight loss efforts and has agreed to the Category 3 Plan.  Wants to work on getting more protein in and decreasing soda to only 1x a week.   Exercise:  For substantial health benefits, adults should do at least 150 minutes (2 hours and 30 minutes) a week of moderate-intensity, or 75 minutes (1 hour and 15 minutes) a week of vigorous-intensity aerobic physical activity, or an equivalent combination of moderate- and vigorous-intensity aerobic activity. Aerobic activity should be performed in episodes of at least 10 minutes, and preferably, it should be spread throughout the week.  Behavior Modification:  We discussed the following Behavioral Modification Strategies today: increasing lean protein intake, decrease liquid calories, no skipping meals, and meal planning and cooking strategies.   Return in about 3 weeks (around 09/27/2023) for fasting labs.. She was informed of the importance of frequent follow up visits to maximize her success with intensive lifestyle modifications for her multiple health conditions.  Attestation Statements:   Reviewed by clinician on day of visit: allergies, medications, problem list, medical  history, surgical history, family history, social history, and previous encounter notes.   Reuben Likes, MD

## 2023-09-06 NOTE — Assessment & Plan Note (Signed)
 Blood pressure well controlled today.  No chest pain, chest pressure or headache.  Doing well on chlorthalidone, cozaar, procardia.  Follow up on BP at next appointments.

## 2023-09-06 NOTE — Assessment & Plan Note (Signed)
 See anthropometric data.  Patient to get back to weighing and measuring protein for meals on Cat 3.

## 2023-10-05 ENCOUNTER — Ambulatory Visit (INDEPENDENT_AMBULATORY_CARE_PROVIDER_SITE_OTHER): Admitting: Family Medicine

## 2023-10-05 ENCOUNTER — Encounter (INDEPENDENT_AMBULATORY_CARE_PROVIDER_SITE_OTHER): Payer: Self-pay | Admitting: Family Medicine

## 2023-10-05 VITALS — BP 137/87 | HR 97 | Temp 98.0°F | Ht 63.0 in | Wt 273.0 lb

## 2023-10-05 DIAGNOSIS — F32A Depression, unspecified: Secondary | ICD-10-CM | POA: Diagnosis not present

## 2023-10-05 DIAGNOSIS — F419 Anxiety disorder, unspecified: Secondary | ICD-10-CM | POA: Diagnosis not present

## 2023-10-05 DIAGNOSIS — I10 Essential (primary) hypertension: Secondary | ICD-10-CM | POA: Diagnosis not present

## 2023-10-05 DIAGNOSIS — Z6841 Body Mass Index (BMI) 40.0 and over, adult: Secondary | ICD-10-CM

## 2023-10-05 MED ORDER — SERTRALINE HCL 50 MG PO TABS: 50.0000 mg | ORAL_TABLET | Freq: Every day | ORAL | 0 refills | Status: DC

## 2023-10-05 MED ORDER — CHLORTHALIDONE 50 MG PO TABS: 50.0000 mg | ORAL_TABLET | Freq: Every day | ORAL | 0 refills | Status: DC

## 2023-10-05 NOTE — Assessment & Plan Note (Signed)
 Patient is emotional today.  She is looking for a therapist currently.  We discussed systemic symptoms of anxiety today including chest tightness, chest pressure, knot in throat, headache, GI symptoms.  She is experiencing some of these.  We discussed low dose zoloft to help manage symptoms.  Will start 25mg  daily for 5-7 days then increase to 50mg  daily.

## 2023-10-05 NOTE — Progress Notes (Signed)
 SUBJECTIVE:  Chief Complaint: Obesity  Interim History: Patient has been drinking more soda since last appointment due to anxiety. She realizes she needs to get a therapist.  She hasn't gotten one yet due to fears she was going to lose her job.  She has been doing some emotional not eating and skipping breakfast. Patient is upset this am due to job stability concerns and a recent interaction with a state trooper.    Gina Walker is here to discuss her progress with her obesity treatment plan. She is on the Category 3 Plan and states she is following her eating plan approximately 55-60 % of the time. She states she is not exercising.   OBJECTIVE: Visit Diagnoses: Problem List Items Addressed This Visit       Cardiovascular and Mediastinum   Primary hypertension   Blood pressure well controlled today.  She is on chlorthalidone and procardia daily.  No chest pain chest pressure or headache. Continue current medications at same doses.      Relevant Medications   chlorthalidone (HYGROTON) 50 MG tablet     Other   Morbid obesity (HCC) with starting BMI 48.9 (Chronic)   Anthropometric Measurements Height: 5\' 3"  (1.6 m) Weight: 273 lb (123.8 kg) BMI (Calculated): 48.37 Weight at Last Visit: 270 lb Weight Lost Since Last Visit: 0 Weight Gained Since Last Visit: 3 Starting Weight: 276 lb Total Weight Loss (lbs): 3 lb (1.361 kg) Body Composition  Body Fat %: 52.9 % Fat Mass (lbs): 144.8 lbs Muscle Mass (lbs): 122.4 lbs Total Body Water (lbs): 99.8 lbs Visceral Fat Rating : 18 Other Clinical Data Today's Visit #: 17 Starting Date: 05/26/22 Comments: Cat 3       Anxiety and depression - Primary   Patient is emotional today.  She is looking for a therapist currently.  We discussed systemic symptoms of anxiety today including chest tightness, chest pressure, knot in throat, headache, GI symptoms.  She is experiencing some of these.  We discussed low dose zoloft to help manage symptoms.   Will start 25mg  daily for 5-7 days then increase to 50mg  daily.      Relevant Medications   sertraline (ZOLOFT) 50 MG tablet   Other Visit Diagnoses       BMI 45.0-49.9, adult (HCC),Current BMI 48.55           No data recorded      10/05/2023    9:00 AM 09/06/2023    8:00 AM 08/16/2023    9:00 AM  Vitals with BMI  Height 5\' 3"  5\' 3"  5\' 3"   Weight 273 lbs 270 lbs 276 lbs  BMI 48.37 47.84 48.9  Systolic 137 121 161  Diastolic 87 63 86  Pulse 97 73 78       ASSESSMENT AND PLAN:  Diet: Opha is currently in the action stage of change. As such, her goal is to continue with weight loss efforts and has agreed to the Category 3 Plan.   Exercise:  All adults should avoid inactivity. Some activity is better than none, and adults who participate in any amount of physical activity, gain some health benefits.  Behavior Modification:  We discussed the following Behavioral Modification Strategies today: increasing lean protein intake, decreasing simple carbohydrates, no skipping meals, meal planning and cooking strategies, and keeping healthy foods in the home.   Return in about 4 weeks (around 11/02/2023).Aaron Aas She was informed of the importance of frequent follow up visits to maximize her success with intensive lifestyle modifications  for her multiple health conditions.  Attestation Statements:   Reviewed by clinician on day of visit: allergies, medications, problem list, medical history, surgical history, family history, social history, and previous encounter notes.     Donaciano Frizzle, MD

## 2023-10-10 NOTE — Assessment & Plan Note (Signed)
 Anthropometric Measurements Height: 5\' 3"  (1.6 m) Weight: 273 lb (123.8 kg) BMI (Calculated): 48.37 Weight at Last Visit: 270 lb Weight Lost Since Last Visit: 0 Weight Gained Since Last Visit: 3 Starting Weight: 276 lb Total Weight Loss (lbs): 3 lb (1.361 kg) Body Composition  Body Fat %: 52.9 % Fat Mass (lbs): 144.8 lbs Muscle Mass (lbs): 122.4 lbs Total Body Water (lbs): 99.8 lbs Visceral Fat Rating : 18 Other Clinical Data Today's Visit #: 17 Starting Date: 05/26/22 Comments: Cat 3

## 2023-10-10 NOTE — Assessment & Plan Note (Addendum)
 Blood pressure well controlled today.  She is on chlorthalidone and procardia daily.  No chest pain chest pressure or headache. Continue current medications at same doses.

## 2023-10-23 ENCOUNTER — Other Ambulatory Visit (INDEPENDENT_AMBULATORY_CARE_PROVIDER_SITE_OTHER): Payer: Self-pay | Admitting: Adult Health

## 2023-10-23 DIAGNOSIS — I10 Essential (primary) hypertension: Secondary | ICD-10-CM

## 2023-10-26 ENCOUNTER — Other Ambulatory Visit (INDEPENDENT_AMBULATORY_CARE_PROVIDER_SITE_OTHER): Payer: Self-pay | Admitting: Adult Health

## 2023-10-26 DIAGNOSIS — I10 Essential (primary) hypertension: Secondary | ICD-10-CM

## 2023-10-31 ENCOUNTER — Encounter (INDEPENDENT_AMBULATORY_CARE_PROVIDER_SITE_OTHER): Payer: Self-pay | Admitting: Family Medicine

## 2023-10-31 ENCOUNTER — Ambulatory Visit (INDEPENDENT_AMBULATORY_CARE_PROVIDER_SITE_OTHER): Admitting: Family Medicine

## 2023-10-31 VITALS — BP 141/84 | Temp 98.6°F | Ht 63.0 in | Wt 277.0 lb

## 2023-10-31 DIAGNOSIS — R7303 Prediabetes: Secondary | ICD-10-CM | POA: Diagnosis not present

## 2023-10-31 DIAGNOSIS — E559 Vitamin D deficiency, unspecified: Secondary | ICD-10-CM | POA: Diagnosis not present

## 2023-10-31 DIAGNOSIS — E785 Hyperlipidemia, unspecified: Secondary | ICD-10-CM

## 2023-10-31 DIAGNOSIS — F419 Anxiety disorder, unspecified: Secondary | ICD-10-CM

## 2023-10-31 DIAGNOSIS — Z6841 Body Mass Index (BMI) 40.0 and over, adult: Secondary | ICD-10-CM

## 2023-10-31 DIAGNOSIS — E7849 Other hyperlipidemia: Secondary | ICD-10-CM

## 2023-10-31 DIAGNOSIS — I10 Essential (primary) hypertension: Secondary | ICD-10-CM

## 2023-10-31 DIAGNOSIS — F32A Depression, unspecified: Secondary | ICD-10-CM

## 2023-10-31 MED ORDER — SERTRALINE HCL 50 MG PO TABS: 50.0000 mg | ORAL_TABLET | Freq: Every day | ORAL | 0 refills | Status: DC

## 2023-10-31 MED ORDER — VITAMIN D (ERGOCALCIFEROL) 1.25 MG (50000 UNIT) PO CAPS: 50000.0000 [IU] | ORAL_CAPSULE | ORAL | 0 refills | Status: DC

## 2023-10-31 MED ORDER — NIFEDIPINE ER OSMOTIC RELEASE 90 MG PO TB24
90.0000 mg | ORAL_TABLET | Freq: Every day | ORAL | 0 refills | Status: DC
Start: 2023-10-31 — End: 2024-01-25

## 2023-10-31 MED ORDER — LOSARTAN POTASSIUM 25 MG PO TABS: 25.0000 mg | ORAL_TABLET | Freq: Every day | ORAL | 0 refills | Status: DC

## 2023-10-31 NOTE — Progress Notes (Signed)
 SUBJECTIVE:  Chief Complaint: Obesity  Interim History: Last few weeks were better than the prior few.  Eating about 80-85% on plan.  She has had a couple of fatty meals but then gets back on plan.  Meat is what she is focusing on and she is eating a lot of rotesserie chicken.  She ate quite a bit at a weekend this past weekend; more than she normally eats. Next few weeks she only has work and church.  No planned travel for the summer so far. Forsees getting enough sleep to be her biggest obstacle in the next few weeks.   Gina Walker is here to discuss her progress with her obesity treatment plan. She is on the Category 3 Plan and states she is following her eating plan approximately 85 % of the time. She states she is not exercising.   OBJECTIVE: Visit Diagnoses: Problem List Items Addressed This Visit       Cardiovascular and Mediastinum   Primary hypertension   Blood pressure slightly high today.  No chest pain, chest pressure or headache.  Needs a refill of her procardia  and losartan  today.  CMP ordered to assess electrolytes, kidney and liver function.      Relevant Medications   NIFEdipine  (PROCARDIA  XL/NIFEDICAL-XL) 90 MG 24 hr tablet   losartan  (COZAAR ) 25 MG tablet   Other Relevant Orders   Comprehensive metabolic panel with GFR (Completed)     Other   Morbid obesity (HCC) with starting BMI 48.9 (Chronic)   Anthropometric Measurements Height: 5\' 3"  (1.6 m) Weight: 277 lb (125.6 kg) BMI (Calculated): 49.08 Weight at Last Visit: 273 lb Weight Lost Since Last Visit: 0 Weight Gained Since Last Visit: 4 Starting Weight: 276 lb Total Weight Loss (lbs): 1 lb (0.454 kg) Body Composition  Body Fat %: 54 % Fat Mass (lbs): 149.6 lbs Muscle Mass (lbs): 121 lbs Visceral Fat Rating : 19 Other Clinical Data Fasting: yes Labs: yes Today's Visit #: 18 Starting Date: 05/26/22 Comments: Cat 3       Hyperlipidemia   Repeating FLP today to reassess cholesterol levels.  Patient  working on lifestyle changes and is on cholesterol controlling medication.  Plan to discuss results at next appointment.      Relevant Medications   NIFEdipine  (PROCARDIA  XL/NIFEDICAL-XL) 90 MG 24 hr tablet   losartan  (COZAAR ) 25 MG tablet   Other Relevant Orders   Lipid Panel With LDL/HDL Ratio (Completed)   Pre-diabetes - Primary   Relevant Orders   Hemoglobin A1c (Completed)   Insulin , random (Completed)   Anxiety and depression   On sertraline  50mg  daily currently.  Needs a refill.  No suicidal or homicidal ideation.  Plan to follow up on symptom control at next appointment.      Relevant Medications   sertraline  (ZOLOFT ) 50 MG tablet   Vitamin D  deficiency   On prescription strength Vitamin D .  Fatigue improving.  Needs refill today and will order Vitamin D  level to assess response to supplementation.      Relevant Medications   Vitamin D , Ergocalciferol , (DRISDOL ) 1.25 MG (50000 UNIT) CAPS capsule   Other Relevant Orders   VITAMIN D  25 Hydroxy (Vit-D Deficiency, Fractures) (Completed)    No data recorded       10/31/2023    8:00 AM 10/05/2023    9:00 AM 09/06/2023    8:00 AM  Vitals with BMI  Height 5\' 3"  5\' 3"  5\' 3"   Weight 277 lbs 273 lbs 270 lbs  BMI  49.08 48.37 47.84  Systolic 141 137 657  Diastolic 84 87 63  Pulse  97 73      ASSESSMENT AND PLAN:  Diet: Gina Walker is currently in the action stage of change. As such, her goal is to continue with weight loss efforts and has agreed to the Category 3 Plan.   Exercise:  For substantial health benefits, adults should do at least 150 minutes (2 hours and 30 minutes) a week of moderate-intensity, or 75 minutes (1 hour and 15 minutes) a week of vigorous-intensity aerobic physical activity, or an equivalent combination of moderate- and vigorous-intensity aerobic activity. Aerobic activity should be performed in episodes of at least 10 minutes, and preferably, it should be spread throughout the week.  Behavior  Modification:  We discussed the following Behavioral Modification Strategies today: increasing lean protein intake, decreasing simple carbohydrates, increasing vegetables, meal planning and cooking strategies, keeping healthy foods in the home, avoiding temptations, and planning for success.   Return in about 4 weeks (around 11/28/2023).   She was informed of the importance of frequent follow up visits to maximize her success with intensive lifestyle modifications for her multiple health conditions.  Attestation Statements:   Reviewed by clinician on day of visit: allergies, medications, problem list, medical history, surgical history, family history, social history, and previous encounter notes.     Donaciano Frizzle, MD

## 2023-11-01 LAB — LIPID PANEL WITH LDL/HDL RATIO
Cholesterol, Total: 201 mg/dL — ABNORMAL HIGH (ref 100–199)
HDL: 66 mg/dL (ref >39)
LDL Chol Calc (NIH): 118 mg/dL — ABNORMAL HIGH (ref 0–99)
LDL/HDL Ratio: 1.8 ratio (ref 0.0–3.2)
Triglycerides: 97 mg/dL (ref 0–149)
VLDL Cholesterol Cal: 17 mg/dL (ref 5–40)

## 2023-11-01 LAB — COMPREHENSIVE METABOLIC PANEL WITH GFR
ALT: 25 IU/L (ref 0–32)
AST: 26 IU/L (ref 0–40)
Albumin: 4.3 g/dL (ref 3.9–4.9)
Alkaline Phosphatase: 91 IU/L (ref 44–121)
BUN/Creatinine Ratio: 8 — ABNORMAL LOW (ref 9–23)
BUN: 8 mg/dL (ref 6–24)
Bilirubin Total: <0.2 mg/dL (ref 0.0–1.2)
CO2: 28 mmol/L (ref 20–29)
Calcium: 9.8 mg/dL (ref 8.7–10.2)
Chloride: 96 mmol/L (ref 96–106)
Creatinine, Ser: 1.03 mg/dL — ABNORMAL HIGH (ref 0.57–1.00)
Globulin, Total: 2.8 g/dL (ref 1.5–4.5)
Glucose: 106 mg/dL — ABNORMAL HIGH (ref 70–99)
Potassium: 3.4 mmol/L — ABNORMAL LOW (ref 3.5–5.2)
Sodium: 140 mmol/L (ref 134–144)
Total Protein: 7.1 g/dL (ref 6.0–8.5)
eGFR: 69 mL/min/{1.73_m2} (ref >59)

## 2023-11-01 LAB — VITAMIN D 25 HYDROXY (VIT D DEFICIENCY, FRACTURES): Vit D, 25-Hydroxy: 53.2 ng/mL (ref 30.0–100.0)

## 2023-11-01 LAB — INSULIN, RANDOM: INSULIN: 29.2 u[IU]/mL — ABNORMAL HIGH (ref 2.6–24.9)

## 2023-11-01 LAB — HEMOGLOBIN A1C
Est. average glucose Bld gHb Est-mCnc: 131 mg/dL
Hgb A1c MFr Bld: 6.2 % — ABNORMAL HIGH (ref 4.8–5.6)

## 2023-11-08 NOTE — Assessment & Plan Note (Signed)
 Repeating FLP today to reassess cholesterol levels.  Patient working on lifestyle changes and is on cholesterol controlling medication.  Plan to discuss results at next appointment.

## 2023-11-08 NOTE — Assessment & Plan Note (Signed)
 Anthropometric Measurements Height: 5\' 3"  (1.6 m) Weight: 277 lb (125.6 kg) BMI (Calculated): 49.08 Weight at Last Visit: 273 lb Weight Lost Since Last Visit: 0 Weight Gained Since Last Visit: 4 Starting Weight: 276 lb Total Weight Loss (lbs): 1 lb (0.454 kg) Body Composition  Body Fat %: 54 % Fat Mass (lbs): 149.6 lbs Muscle Mass (lbs): 121 lbs Visceral Fat Rating : 19 Other Clinical Data Fasting: yes Labs: yes Today's Visit #: 18 Starting Date: 05/26/22 Comments: Cat 3

## 2023-11-08 NOTE — Assessment & Plan Note (Signed)
 On prescription strength Vitamin D .  Fatigue improving.  Needs refill today and will order Vitamin D  level to assess response to supplementation.

## 2023-11-08 NOTE — Assessment & Plan Note (Signed)
 Blood pressure slightly high today.  No chest pain, chest pressure or headache.  Needs a refill of her procardia  and losartan  today.  CMP ordered to assess electrolytes, kidney and liver function.

## 2023-11-08 NOTE — Assessment & Plan Note (Signed)
 On sertraline  50mg  daily currently.  Needs a refill.  No suicidal or homicidal ideation.  Plan to follow up on symptom control at next appointment.

## 2023-11-11 ENCOUNTER — Other Ambulatory Visit (INDEPENDENT_AMBULATORY_CARE_PROVIDER_SITE_OTHER): Payer: Self-pay | Admitting: Family Medicine

## 2023-11-11 DIAGNOSIS — F419 Anxiety disorder, unspecified: Secondary | ICD-10-CM

## 2023-11-13 ENCOUNTER — Other Ambulatory Visit (INDEPENDENT_AMBULATORY_CARE_PROVIDER_SITE_OTHER): Payer: Self-pay | Admitting: Family Medicine

## 2023-11-13 DIAGNOSIS — F32A Depression, unspecified: Secondary | ICD-10-CM

## 2023-11-28 ENCOUNTER — Ambulatory Visit (INDEPENDENT_AMBULATORY_CARE_PROVIDER_SITE_OTHER): Admitting: Family Medicine

## 2023-11-28 ENCOUNTER — Encounter (INDEPENDENT_AMBULATORY_CARE_PROVIDER_SITE_OTHER): Payer: Self-pay | Admitting: Family Medicine

## 2023-11-28 VITALS — BP 122/80 | HR 76 | Temp 98.2°F | Ht 63.0 in | Wt 275.0 lb

## 2023-11-28 DIAGNOSIS — I10 Essential (primary) hypertension: Secondary | ICD-10-CM

## 2023-11-28 DIAGNOSIS — E785 Hyperlipidemia, unspecified: Secondary | ICD-10-CM | POA: Diagnosis not present

## 2023-11-28 DIAGNOSIS — E782 Mixed hyperlipidemia: Secondary | ICD-10-CM

## 2023-11-28 DIAGNOSIS — Z6841 Body Mass Index (BMI) 40.0 and over, adult: Secondary | ICD-10-CM

## 2023-11-28 DIAGNOSIS — R7303 Prediabetes: Secondary | ICD-10-CM | POA: Diagnosis not present

## 2023-11-28 MED ORDER — METFORMIN HCL 500 MG PO TABS
500.0000 mg | ORAL_TABLET | Freq: Every day | ORAL | 0 refills | Status: DC
Start: 2023-11-28 — End: 2023-12-26

## 2023-11-28 NOTE — Progress Notes (Signed)
 SUBJECTIVE:  Chief Complaint: Obesity  Interim History: Patient has been sick over the last few weeks- sinusitis, ear infection with effusion.  She has done about 95% of the category 3 since last appointment.  She has been eating more [place] sandwiches, beans, eggs, protein.  Over the next month she doesn't have much in terms in travel or activity.  She may have an upcoming family reunion.  Goal is to increase protein and meat.    Gina Walker is here to discuss her progress with her obesity treatment plan. She is on the Category 3 Plan and states she is following her eating plan approximately 95 % of the time. She states she is exercising.   OBJECTIVE: Visit Diagnoses: Problem List Items Addressed This Visit       Cardiovascular and Mediastinum   Primary hypertension   Blood pressure well controlled today.  No chest pain, chest pressure or headache.  Needs no changes in medication or dosages at this time.  Follow up at BP at next appointment.        Other   Morbid obesity (HCC) with starting BMI 48.9 (Chronic)   Anthropometric Measurements Height: 5' 3 (1.6 m) Weight: 275 lb (124.7 kg) BMI (Calculated): 48.73 Weight at Last Visit: 277 lb Weight Lost Since Last Visit: 2 Weight Gained Since Last Visit: 0 Starting Weight: 276 lb Total Weight Loss (lbs): 1 lb (0.454 kg) Body Composition  Body Fat %: 54.1 % Fat Mass (lbs): 148.8 lbs Muscle Mass (lbs): 119.8 lbs Visceral Fat Rating : 18 Other Clinical Data Today's Visit #: 19 Starting Date: 05/26/22 Comments: Cat 3       Relevant Medications   metFORMIN  (GLUCOPHAGE ) 500 MG tablet   Hyperlipidemia   The 10-year ASCVD risk score (Arnett DK, et al., 2019) is: 2.8%   Values used to calculate the score:     Age: 43 years     Sex: Female     Is Non-Hispanic African American: Yes     Diabetic: Yes     Tobacco smoker: No     Systolic Blood Pressure: 122 mmHg     Is BP treated: Yes     HDL Cholesterol: 66 mg/dL     Total  Cholesterol: 201 mg/dL       Pre-diabetes - Primary   On metformin  daily- no reported side effects.  She is working on increasing total protein and being more in control of simple carbohydrates daily.  Needs a refill today- no change in dose.      Relevant Medications   metFORMIN  (GLUCOPHAGE ) 500 MG tablet   Other Visit Diagnoses       BMI 45.0-49.9, adult (HCC),Current BMI 48.55       Relevant Medications   metFORMIN  (GLUCOPHAGE ) 500 MG tablet       No data recorded       11/28/2023    9:00 AM 10/31/2023    8:00 AM 10/05/2023    9:00 AM  Vitals with BMI  Height 5' 3 5' 3 5' 3  Weight 275 lbs 277 lbs 273 lbs  BMI 48.73 49.08 48.37  Systolic 122 141 161  Diastolic 80 84 87  Pulse 76  97      ASSESSMENT AND PLAN:  Diet: [name] is currently in the action stage of change. As such, her goal is to continue with weight loss efforts and has agreed to the Category 3 Plan.   Exercise:  For substantial health benefits, adults should do at  least 150 minutes (2 hours and 30 minutes) a week of moderate-intensity, or 75 minutes (1 hour and 15 minutes) a week of vigorous-intensity aerobic physical activity, or an equivalent combination of moderate- and vigorous-intensity aerobic activity. Aerobic activity should be performed in episodes of at least 10 minutes, and preferably, it should be spread throughout the week.  Patient to make a commitment to walk 10-15 minutes 3-4 times a week.  Behavior Modification:  We discussed the following Behavioral Modification Strategies today: increasing lean protein intake, decreasing simple carbohydrates, increasing vegetables, meal planning and cooking strategies, and keeping healthy foods in the home.   Return in about 4 weeks (around 12/26/2023).   She was informed of the importance of frequent follow up visits to maximize her success with intensive lifestyle modifications for her multiple health conditions.  Attestation Statements:    Reviewed by clinician on day of visit: allergies, medications, problem list, medical history, surgical history, family history, social history, and previous encounter notes.   Donaciano Frizzle, MD

## 2023-11-28 NOTE — Assessment & Plan Note (Signed)
 The 10-year ASCVD risk score (Arnett DK, et al., 2019) is: 2.8%   Values used to calculate the score:     Age: 43 years     Sex: Female     Is Non-Hispanic African American: Yes     Diabetic: Yes     Tobacco smoker: No     Systolic Blood Pressure: 122 mmHg     Is BP treated: Yes     HDL Cholesterol: 66 mg/dL     Total Cholesterol: 201 mg/dL

## 2023-12-07 NOTE — Assessment & Plan Note (Signed)
 Blood pressure well controlled today.  No chest pain, chest pressure or headache.  Needs no changes in medication or dosages at this time.  Follow up at BP at next appointment.

## 2023-12-07 NOTE — Assessment & Plan Note (Signed)
 On metformin  daily- no reported side effects.  She is working on increasing total protein and being more in control of simple carbohydrates daily.  Needs a refill today- no change in dose.

## 2023-12-07 NOTE — Assessment & Plan Note (Signed)
 Anthropometric Measurements Height: 5' 3 (1.6 m) Weight: 275 lb (124.7 kg) BMI (Calculated): 48.73 Weight at Last Visit: 277 lb Weight Lost Since Last Visit: 2 Weight Gained Since Last Visit: 0 Starting Weight: 276 lb Total Weight Loss (lbs): 1 lb (0.454 kg) Body Composition  Body Fat %: 54.1 % Fat Mass (lbs): 148.8 lbs Muscle Mass (lbs): 119.8 lbs Visceral Fat Rating : 18 Other Clinical Data Today's Visit #: 19 Starting Date: 05/26/22 Comments: Cat 3

## 2023-12-19 ENCOUNTER — Other Ambulatory Visit (INDEPENDENT_AMBULATORY_CARE_PROVIDER_SITE_OTHER): Payer: Self-pay | Admitting: Family Medicine

## 2023-12-19 DIAGNOSIS — E559 Vitamin D deficiency, unspecified: Secondary | ICD-10-CM

## 2023-12-23 ENCOUNTER — Other Ambulatory Visit (INDEPENDENT_AMBULATORY_CARE_PROVIDER_SITE_OTHER): Payer: Self-pay | Admitting: Family Medicine

## 2023-12-23 DIAGNOSIS — I10 Essential (primary) hypertension: Secondary | ICD-10-CM

## 2023-12-26 ENCOUNTER — Encounter (INDEPENDENT_AMBULATORY_CARE_PROVIDER_SITE_OTHER): Payer: Self-pay | Admitting: Family Medicine

## 2023-12-26 ENCOUNTER — Ambulatory Visit (INDEPENDENT_AMBULATORY_CARE_PROVIDER_SITE_OTHER): Admitting: Family Medicine

## 2023-12-26 VITALS — BP 131/80 | HR 81 | Temp 98.1°F | Ht 63.0 in | Wt 276.0 lb

## 2023-12-26 DIAGNOSIS — E785 Hyperlipidemia, unspecified: Secondary | ICD-10-CM

## 2023-12-26 DIAGNOSIS — Z6841 Body Mass Index (BMI) 40.0 and over, adult: Secondary | ICD-10-CM | POA: Diagnosis not present

## 2023-12-26 DIAGNOSIS — R7303 Prediabetes: Secondary | ICD-10-CM

## 2023-12-26 DIAGNOSIS — E782 Mixed hyperlipidemia: Secondary | ICD-10-CM

## 2023-12-26 MED ORDER — METFORMIN HCL 500 MG PO TABS: 500.0000 mg | ORAL_TABLET | Freq: Two times a day (BID) | ORAL | Status: DC

## 2023-12-26 NOTE — Progress Notes (Signed)
 SUBJECTIVE:  Chief Complaint: Obesity  Interim History: Patient is working on doing 15 minutes 4 times a week of activity.  She is eating 80% on plan because she is getting hungrier.  She is eating [place] in the am to help get all her protein in.  Lunch she is doing salad with rotesserie chicken or [place].  Dinner is chicken or red meat about 8 oz then vegetables.  She is hungry in the middle of the night.  Breakfast is cheese and 4 oz of [place] and occasionally has an egg on it too. She will eat fruit with breakfast or lunch.  For snacks she is doing the cucumbers in the middle of the night.  For next few weeks she does not have any trips or activities.  Gina Walker is here to discuss her progress with her obesity treatment plan. She is on the Category 3 Plan and states she is following her eating plan approximately 80 % of the time. She states she is exercising 15+ minutes 4 times per week.   OBJECTIVE: Visit Diagnoses: Problem List Items Addressed This Visit       Other   Morbid obesity (HCC) with starting BMI 48.9 (Chronic)   Relevant Medications   metFORMIN  (GLUCOPHAGE ) 500 MG tablet   Hyperlipidemia   Recent LDL increased from prior lab but HDL also increased.  The 10-year ASCVD risk score (Arnett DK, et al., 2019) is: 4%   Values used to calculate the score:     Age: 43 years     Clincally relevant sex: Female     Is Non-Hispanic African American: Yes     Diabetic: Yes     Tobacco smoker: No     Systolic Blood Pressure: 131 mmHg     Is BP treated: Yes     HDL Cholesterol: 66 mg/dL     Total Cholesterol: 201 mg/dL   Patient to continue to control saturated fat intake to <20 of total intake.      Pre-diabetes - Primary   Patient on metformin  daily and not experiencing any GI side effects.  Recent A1c of 6.2 in early May.  Will increase metformin  to twice daily- patient to MyChart with any concerns.      Relevant Medications   metFORMIN  (GLUCOPHAGE ) 500 MG tablet    Other Visit Diagnoses       BMI 45.0-49.9, adult (HCC),Current BMI 48.55       Relevant Medications   metFORMIN  (GLUCOPHAGE ) 500 MG tablet       Vitals Temp: 98.1 F (36.7 C) BP: 131/80 Pulse Rate: 81 SpO2: 100 %   Anthropometric Measurements Height: 5' 3 (1.6 m) Weight: 276 lb (125.2 kg) BMI (Calculated): 48.9 Weight at Last Visit: 275 lb Weight Lost Since Last Visit: 0 Weight Gained Since Last Visit: 1 Starting Weight: 276 lb Total Weight Loss (lbs): 0 lb (0 kg)   Body Composition  Body Fat %: 47.5 % Fat Mass (lbs): 131.4 lbs Muscle Mass (lbs): 137.8 lbs Total Body Water (lbs): 108.4 lbs Visceral Fat Rating : 16   Other Clinical Data Fasting: yes Labs: no Today's Visit #: 20 Starting Date: 05/26/22 Comments: Cat 3     ASSESSMENT AND PLAN:  Diet: Gina Walker is currently in the action stage of change. As such, her goal is to continue with weight loss efforts and has agreed to the Category 3 Plan.   Exercise:  For substantial health benefits, adults should do at least 150 minutes (2 hours and  30 minutes) a week of moderate-intensity, or 75 minutes (1 hour and 15 minutes) a week of vigorous-intensity aerobic physical activity, or an equivalent combination of moderate- and vigorous-intensity aerobic activity. Aerobic activity should be performed in episodes of at least 10 minutes, and preferably, it should be spread throughout the week.  Patient is being consistent with 15 minute activity at least 4 times a week.  Behavior Modification:  We discussed the following Behavioral Modification Strategies today: increasing lean protein intake, decreasing simple carbohydrates, increasing vegetables, and planning for success.   Return in about 4 weeks (around 01/23/2024) for repeat IC.   She was informed of the importance of frequent follow up visits to maximize her success with intensive lifestyle modifications for her multiple health conditions.  Attestation  Statements:   Reviewed by clinician on day of visit: allergies, medications, problem list, medical history, surgical history, family history, social history, and previous encounter notes.     Gina Cho, MD

## 2023-12-26 NOTE — Assessment & Plan Note (Signed)
 Recent LDL increased from prior lab but HDL also increased.  The 10-year ASCVD risk score (Arnett DK, et al., 2019) is: 4%   Values used to calculate the score:     Age: 43 years     Clincally relevant sex: Female     Is Non-Hispanic African American: Yes     Diabetic: Yes     Tobacco smoker: No     Systolic Blood Pressure: 131 mmHg     Is BP treated: Yes     HDL Cholesterol: 66 mg/dL     Total Cholesterol: 201 mg/dL   Patient to continue to control saturated fat intake to <20 of total intake.

## 2023-12-26 NOTE — Assessment & Plan Note (Signed)
 Patient on metformin  daily and not experiencing any GI side effects.  Recent A1c of 6.2 in early May.  Will increase metformin  to twice daily- patient to MyChart with any concerns.

## 2024-01-06 ENCOUNTER — Other Ambulatory Visit (INDEPENDENT_AMBULATORY_CARE_PROVIDER_SITE_OTHER): Payer: Self-pay | Admitting: Family Medicine

## 2024-01-06 DIAGNOSIS — F419 Anxiety disorder, unspecified: Secondary | ICD-10-CM

## 2024-01-25 ENCOUNTER — Encounter (INDEPENDENT_AMBULATORY_CARE_PROVIDER_SITE_OTHER): Payer: Self-pay | Admitting: Family Medicine

## 2024-01-25 ENCOUNTER — Ambulatory Visit (INDEPENDENT_AMBULATORY_CARE_PROVIDER_SITE_OTHER): Admitting: Family Medicine

## 2024-01-25 VITALS — BP 138/83 | HR 88 | Temp 97.8°F | Ht 63.0 in | Wt 274.0 lb

## 2024-01-25 DIAGNOSIS — F32A Depression, unspecified: Secondary | ICD-10-CM

## 2024-01-25 DIAGNOSIS — F419 Anxiety disorder, unspecified: Secondary | ICD-10-CM | POA: Diagnosis not present

## 2024-01-25 DIAGNOSIS — R0602 Shortness of breath: Secondary | ICD-10-CM | POA: Diagnosis not present

## 2024-01-25 DIAGNOSIS — I10 Essential (primary) hypertension: Secondary | ICD-10-CM | POA: Diagnosis not present

## 2024-01-25 DIAGNOSIS — R7303 Prediabetes: Secondary | ICD-10-CM | POA: Diagnosis not present

## 2024-01-25 DIAGNOSIS — E669 Obesity, unspecified: Secondary | ICD-10-CM

## 2024-01-25 DIAGNOSIS — Z6841 Body Mass Index (BMI) 40.0 and over, adult: Secondary | ICD-10-CM

## 2024-01-25 MED ORDER — METFORMIN HCL 500 MG PO TABS: 500.0000 mg | ORAL_TABLET | Freq: Two times a day (BID) | ORAL | 0 refills | Status: AC

## 2024-01-25 MED ORDER — CHLORTHALIDONE 50 MG PO TABS
50.0000 mg | ORAL_TABLET | Freq: Every day | ORAL | 0 refills | Status: DC
Start: 2024-01-25 — End: 2024-04-25

## 2024-01-25 MED ORDER — SERTRALINE HCL 50 MG PO TABS: 50.0000 mg | ORAL_TABLET | Freq: Every day | ORAL | 0 refills | Status: DC

## 2024-01-25 MED ORDER — LOSARTAN POTASSIUM 25 MG PO TABS
25.0000 mg | ORAL_TABLET | Freq: Every day | ORAL | 0 refills | Status: DC
Start: 2024-01-25 — End: 2024-04-25

## 2024-01-25 MED ORDER — NIFEDIPINE ER OSMOTIC RELEASE 90 MG PO TB24: 90.0000 mg | ORAL_TABLET | Freq: Every day | ORAL | 0 refills | Status: DC

## 2024-01-25 NOTE — Assessment & Plan Note (Addendum)
 Patient fasting today for IC.  Previously resting metabolic rate at 7781 calories on 05/26/22.  Resting metabolic rate today of 2246 calories by indirect calorimetry.  She can increase to Category 4 for more appropriate calorie deficit.

## 2024-01-25 NOTE — Progress Notes (Signed)
 SUBJECTIVE:  Chief Complaint: Obesity  Interim History: Gina Walker has been able to get in 95% of the food on plan over the last 4 weeks.  Gina Walker got premier shakes and has been taking that in daily.  Gina Walker is getting in 8oz at supper daily and has a yasso bar for dessert.  Gina Walker is helping her sister with a wedding in September.  Gina Walker is at her house now and feels Gina Walker can better control her intake.  Gina Walker is here to discuss her progress with her obesity treatment plan. Gina Walker is on the Category 3 Plan and states Gina Walker is following her eating plan approximately 95 % of the time. Gina Walker states Gina Walker is exercising 15 minutes 2 times per week.   OBJECTIVE: Visit Diagnoses: Problem List Items Addressed This Visit   None   Vitals Temp: 97.8 F (36.6 C) BP: 138/83 Pulse Rate: 88 SpO2: 97 %   Anthropometric Measurements Height: 5' 3 (1.6 m) Weight: 274 lb (124.3 kg) BMI (Calculated): 48.55 Weight at Last Visit: 276lb Weight Lost Since Last Visit: 2lb Weight Gained Since Last Visit: 0 Starting Weight: 276lb Total Weight Loss (lbs): 2 lb (0.907 kg)   Body Composition  Body Fat %: 48.2 % Fat Mass (lbs): 132.2 lbs Muscle Mass (lbs): 135 lbs Total Body Water (lbs): 104.2 lbs Visceral Fat Rating : 16   Other Clinical Data Fasting: yes Labs: yes Today's Visit #: 21 Starting Date: 05/26/22 Comments: Cat 3     ASSESSMENT AND PLAN: Assessment & Plan SOBOE (shortness of breath on exertion) Patient fasting today for IC.  Previously resting metabolic rate at 7781 calories on 05/26/22.  Resting metabolic rate today of 2246 calories by indirect calorimetry.  Gina Walker can increase to Category 4 for more appropriate calorie deficit.  Primary hypertension Patient's blood pressure is within normal limits but upper limits today.  Gina Walker needs refills of all 3 blood pressure medications today.  Gina Walker denies chest pain chest pressure and headache today.  Will continue to follow along blood pressures and titrate  medications as tolerated. Pre-diabetes Patient is tolerating metformin  for treatment of her prediabetes.  Gina Walker needs a refill of that today.Most recent labs showing an A1c of 6.2 which was stable from December of last year.  Gina Walker is working on lifestyle changes to better control her simple carbohydrate intake Anxiety and depression Patient reports improvement in symptoms on sertraline .  Denies suicidal and homicidal ideation today.  Needs a refill of sertraline  at current dose.   Diet: Gina Walker is currently in the action stage of change. As such, her goal is to continue with weight loss efforts and has agreed to the Category 4 Plan.   Exercise:  For additional and more extensive health benefits, adults should increase their aerobic physical activity to 300 minutes (5 hours) a week of moderate-intensity, or 150 minutes a week of vigorous-intensity aerobic physical activity, or an equivalent combination of moderate- and vigorous-intensity activity. Additional health benefits are gained by engaging in physical activity beyond this amount.   Behavior Modification:  We discussed the following Behavioral Modification Strategies today: increasing lean protein intake, decreasing simple carbohydrates, increasing vegetables, meal planning and cooking strategies, and avoiding temptations.   No follow-ups on file.   Gina Walker was informed of the importance of frequent follow up visits to maximize her success with intensive lifestyle modifications for her multiple health conditions.  Attestation Statements:   Reviewed by clinician on day of visit: allergies, medications, problem list, medical history, surgical history, family  history, social history, and previous encounter notes.     Gina Cho, MD

## 2024-02-01 NOTE — Assessment & Plan Note (Signed)
 Patient's blood pressure is within normal limits but upper limits today.  She needs refills of all 3 blood pressure medications today.  She denies chest pain chest pressure and headache today.  Will continue to follow along blood pressures and titrate medications as tolerated.

## 2024-02-01 NOTE — Progress Notes (Signed)
 This encounter was created in error - please disregard.

## 2024-02-01 NOTE — Assessment & Plan Note (Signed)
 Patient is tolerating metformin  for treatment of her prediabetes.  She needs a refill of that today.Most recent labs showing an A1c of 6.2 which was stable from December of last year.  She is working on lifestyle changes to better control her simple carbohydrate intake

## 2024-02-01 NOTE — Assessment & Plan Note (Signed)
 Patient reports improvement in symptoms on sertraline .  Denies suicidal and homicidal ideation today.  Needs a refill of sertraline  at current dose.

## 2024-02-22 ENCOUNTER — Encounter (INDEPENDENT_AMBULATORY_CARE_PROVIDER_SITE_OTHER): Payer: Self-pay | Admitting: Family Medicine

## 2024-02-22 ENCOUNTER — Ambulatory Visit (INDEPENDENT_AMBULATORY_CARE_PROVIDER_SITE_OTHER): Admitting: Family Medicine

## 2024-02-22 VITALS — BP 123/77 | HR 60 | Temp 98.4°F | Ht 63.0 in | Wt 279.0 lb

## 2024-02-22 DIAGNOSIS — I10 Essential (primary) hypertension: Secondary | ICD-10-CM

## 2024-02-22 DIAGNOSIS — E559 Vitamin D deficiency, unspecified: Secondary | ICD-10-CM

## 2024-02-22 DIAGNOSIS — E7849 Other hyperlipidemia: Secondary | ICD-10-CM | POA: Diagnosis not present

## 2024-02-22 DIAGNOSIS — Z6841 Body Mass Index (BMI) 40.0 and over, adult: Secondary | ICD-10-CM

## 2024-02-22 DIAGNOSIS — R7303 Prediabetes: Secondary | ICD-10-CM | POA: Diagnosis not present

## 2024-02-22 NOTE — Progress Notes (Signed)
 SUBJECTIVE:  Chief Complaint: Obesity  Interim History: Patient ran out of money recently and so she has been eating whatever was available to her.  She made smoothies as she has fruit available.  She realizes she wasn't eating on plan.  She did eat at her sister's house so she those meals she was able to eat healthier.  Has been eating some of the higher protein soups.  Planning on going to the grocery store today as she just got paid today.  No plans for this weekend (Labor Day weekend).  She has an upcoming wedding in September that she is helping decorate and attend.   Gina Walker is here to discuss her progress with her obesity treatment plan. She is on the Category 4 Plan and states she is following her eating plan approximately 50 % of the time. She states she is exercising 5-15 minutes 4-5 times per week- walking in place.   OBJECTIVE: Visit Diagnoses: Problem List Items Addressed This Visit       Cardiovascular and Mediastinum   Primary hypertension   Relevant Orders   Comprehensive metabolic panel with GFR     Other   Morbid obesity (HCC) with starting BMI 48.9 (Chronic)   Hyperlipidemia   Relevant Orders   Lipid Panel With LDL/HDL Ratio   Pre-diabetes - Primary   Relevant Orders   Hemoglobin A1c   Insulin , random   Vitamin D  deficiency   Relevant Orders   VITAMIN D  25 Hydroxy (Vit-D Deficiency, Fractures)   Other Visit Diagnoses       BMI 45.0-49.9, adult (HCC),Current BMI 48.55           Vitals Temp: 98.4 F (36.9 C) BP: 123/77 Pulse Rate: 60 SpO2: 100 %   Anthropometric Measurements Height: 5' 3 (1.6 m) Weight: 279 lb (126.6 kg) BMI (Calculated): 49.44 Weight at Last Visit: 274 lb Weight Lost Since Last Visit: 0 Weight Gained Since Last Visit: 5 Starting Weight: 276 lb Total Weight Loss (lbs): 0 lb (0 kg)   Body Composition  Body Fat %: 53.7 % Fat Mass (lbs): 150.2 lbs Muscle Mass (lbs): 123 lbs Visceral Fat Rating : 19   Other Clinical  Data Fasting: yes Labs: no Today's Visit #: 22 Starting Date: 05/26/22 Comments: Cat 3     ASSESSMENT AND PLAN: Assessment & Plan Pre-diabetes Will repeat A1c and Insulin  level today.  Patient has been working on implementing consistent lifestyle modifications and working at dietary changes. Primary hypertension Blood pressure well controlled today.  No chest pain, chest pressure or headache.  Repeat CMP today to assess electrolytes and kidney function Other hyperlipidemia Prior labs showing slightly elevated LDL with HDL at goal, as well as triglycerides.  Repeat FLP today. Vitamin D  deficiency Repeat Vitamin D  level today.  No nausea, vomiting or muscle weakness.  Currently on prescription strength Vitamin D  every 2 weeks. Morbid obesity (HCC) with starting BMI 48.9  BMI 45.0-49.9, adult (HCC),Current BMI 48.55    Diet: Gina Walker is currently in the action stage of change. As such, her goal is to continue with weight loss efforts and has agreed to start intermittent fasting with fasting for 16 hours and eating for 8 hours during the day.  Recent data shows intermittent fasting to be advantageous for patients during financial hardships who cannot get the goal nutrition in daily.  She will try to incorporate nutrition when available.   Exercise:  For substantial health benefits, adults should do at least 150 minutes (2 hours  and 30 minutes) a week of moderate-intensity, or 75 minutes (1 hour and 15 minutes) a week of vigorous-intensity aerobic physical activity, or an equivalent combination of moderate- and vigorous-intensity aerobic activity. Aerobic activity should be performed in episodes of at least 10 minutes, and preferably, it should be spread throughout the week.  Behavior Modification:  We discussed the following Behavioral Modification Strategies today: increasing lean protein intake, decreasing simple carbohydrates, increasing vegetables, meal planning and cooking  strategies, and planning for success.   No follow-ups on file.   She was informed of the importance of frequent follow up visits to maximize her success with intensive lifestyle modifications for her multiple health conditions.  Attestation Statements:   Reviewed by clinician on day of visit: allergies, medications, problem list, medical history, surgical history, family history, social history, and previous encounter notes.     [name], MD

## 2024-02-23 LAB — COMPREHENSIVE METABOLIC PANEL WITH GFR
ALT: 27 IU/L (ref 0–32)
AST: 25 IU/L (ref 0–40)
Albumin: 4.2 g/dL (ref 3.9–4.9)
Alkaline Phosphatase: 94 IU/L (ref 44–121)
BUN/Creatinine Ratio: 8 — ABNORMAL LOW (ref 9–23)
BUN: 8 mg/dL (ref 6–24)
Bilirubin Total: 0.3 mg/dL (ref 0.0–1.2)
CO2: 26 mmol/L (ref 20–29)
Calcium: 9.7 mg/dL (ref 8.7–10.2)
Chloride: 95 mmol/L — ABNORMAL LOW (ref 96–106)
Creatinine, Ser: 1.03 mg/dL — ABNORMAL HIGH (ref 0.57–1.00)
Globulin, Total: 2.8 g/dL (ref 1.5–4.5)
Glucose: 100 mg/dL — ABNORMAL HIGH (ref 70–99)
Potassium: 3.1 mmol/L — ABNORMAL LOW (ref 3.5–5.2)
Sodium: 139 mmol/L (ref 134–144)
Total Protein: 7 g/dL (ref 6.0–8.5)
eGFR: 69 mL/min/1.73 (ref >59)

## 2024-02-23 LAB — LIPID PANEL WITH LDL/HDL RATIO
Cholesterol, Total: 209 mg/dL — ABNORMAL HIGH (ref 100–199)
HDL: 59 mg/dL (ref >39)
LDL Chol Calc (NIH): 124 mg/dL — ABNORMAL HIGH (ref 0–99)
LDL/HDL Ratio: 2.1 ratio (ref 0.0–3.2)
Triglycerides: 146 mg/dL (ref 0–149)
VLDL Cholesterol Cal: 26 mg/dL (ref 5–40)

## 2024-02-23 LAB — HEMOGLOBIN A1C
Est. average glucose Bld gHb Est-mCnc: 126 mg/dL
Hgb A1c MFr Bld: 6 % — ABNORMAL HIGH (ref 4.8–5.6)

## 2024-02-23 LAB — INSULIN, RANDOM: INSULIN: 28.6 u[IU]/mL — ABNORMAL HIGH (ref 2.6–24.9)

## 2024-02-23 LAB — VITAMIN D 25 HYDROXY (VIT D DEFICIENCY, FRACTURES): Vit D, 25-Hydroxy: 32.2 ng/mL (ref 30.0–100.0)

## 2024-03-02 NOTE — Assessment & Plan Note (Signed)
 Blood pressure well controlled today.  No chest pain, chest pressure or headache.  Repeat CMP today to assess electrolytes and kidney function

## 2024-03-02 NOTE — Assessment & Plan Note (Signed)
 Prior labs showing slightly elevated LDL with HDL at goal, as well as triglycerides.  Repeat FLP today.

## 2024-03-02 NOTE — Assessment & Plan Note (Signed)
 Will repeat A1c and Insulin  level today.  Patient has been working on implementing consistent lifestyle modifications and working at dietary changes.

## 2024-03-02 NOTE — Assessment & Plan Note (Signed)
 Repeat Vitamin D  level today.  No nausea, vomiting or muscle weakness.  Currently on prescription strength Vitamin D  every 2 weeks.

## 2024-03-12 ENCOUNTER — Other Ambulatory Visit (HOSPITAL_COMMUNITY): Payer: Self-pay | Admitting: Nurse Practitioner

## 2024-03-12 ENCOUNTER — Encounter (HOSPITAL_COMMUNITY): Payer: Self-pay | Admitting: Nurse Practitioner

## 2024-03-12 DIAGNOSIS — M549 Dorsalgia, unspecified: Secondary | ICD-10-CM

## 2024-03-12 DIAGNOSIS — G8929 Other chronic pain: Secondary | ICD-10-CM

## 2024-03-14 ENCOUNTER — Ambulatory Visit (HOSPITAL_COMMUNITY)
Admission: RE | Admit: 2024-03-14 | Discharge: 2024-03-14 | Disposition: A | Discharge status: Home / Self Care | Source: Ambulatory Visit | Attending: Nurse Practitioner | Admitting: Nurse Practitioner

## 2024-03-14 DIAGNOSIS — M549 Dorsalgia, unspecified: Secondary | ICD-10-CM | POA: Insufficient documentation

## 2024-03-14 DIAGNOSIS — G8929 Other chronic pain: Secondary | ICD-10-CM | POA: Diagnosis present

## 2024-03-28 ENCOUNTER — Encounter (INDEPENDENT_AMBULATORY_CARE_PROVIDER_SITE_OTHER): Payer: Self-pay | Admitting: Adult Health

## 2024-03-28 ENCOUNTER — Ambulatory Visit (INDEPENDENT_AMBULATORY_CARE_PROVIDER_SITE_OTHER): Admitting: Adult Health

## 2024-03-28 VITALS — BP 138/89 | HR 74 | Temp 98.2°F | Ht 63.0 in | Wt 279.0 lb

## 2024-03-28 DIAGNOSIS — E559 Vitamin D deficiency, unspecified: Secondary | ICD-10-CM | POA: Diagnosis not present

## 2024-03-28 DIAGNOSIS — I10 Essential (primary) hypertension: Secondary | ICD-10-CM

## 2024-03-28 DIAGNOSIS — R7303 Prediabetes: Secondary | ICD-10-CM | POA: Diagnosis not present

## 2024-03-28 DIAGNOSIS — F32A Depression, unspecified: Secondary | ICD-10-CM

## 2024-03-28 DIAGNOSIS — E782 Mixed hyperlipidemia: Secondary | ICD-10-CM | POA: Diagnosis not present

## 2024-03-28 DIAGNOSIS — F419 Anxiety disorder, unspecified: Secondary | ICD-10-CM

## 2024-03-28 DIAGNOSIS — Z6841 Body Mass Index (BMI) 40.0 and over, adult: Secondary | ICD-10-CM

## 2024-03-28 MED ORDER — VITAMIN D (ERGOCALCIFEROL) 1.25 MG (50000 UNIT) PO CAPS: 50000.0000 [IU] | ORAL_CAPSULE | ORAL | 0 refills | Status: DC

## 2024-03-28 MED ORDER — NIFEDIPINE ER OSMOTIC RELEASE 90 MG PO TB24
90.0000 mg | ORAL_TABLET | Freq: Every day | ORAL | 0 refills | Status: AC
Start: 2024-03-28 — End: ?

## 2024-03-28 MED ORDER — POTASSIUM CHLORIDE CRYS ER 10 MEQ PO TBCR: 10.0000 meq | EXTENDED_RELEASE_TABLET | ORAL | 0 refills | Status: DC

## 2024-03-28 MED ORDER — SERTRALINE HCL 50 MG PO TABS: 50.0000 mg | ORAL_TABLET | Freq: Every day | ORAL | 0 refills | Status: AC

## 2024-03-28 NOTE — Progress Notes (Signed)
 WEIGHT SUMMARY AND BIOMETRICS  Vitals Temp: 98.2 F (36.8 C) BP: 138/89 Pulse Rate: 74 SpO2: 98 %   Anthropometric Measurements Height: 5' 3 (1.6 m) Weight: 279 lb (126.6 kg) BMI (Calculated): 49.44 Weight at Last Visit: 279lb Weight Lost Since Last Visit: 0lb Weight Gained Since Last Visit: 0lb Starting Weight: 276lb Total Weight Loss (lbs): 0 lb (0 kg)   Body Composition  Body Fat %: 53 % Fat Mass (lbs): 148 lbs Muscle Mass (lbs): 124.6 lbs Total Body Water (lbs): 102.6 lbs Visceral Fat Rating : 18   Other Clinical Data Fasting: No Labs: no Today's Visit #: 23 Starting Date: 05/26/22    Chief Complaint:   OBESITY Gina Walker is here to discuss her progress with her obesity treatment plan.  She is on the the Category 4 Plan and states she is following her eating plan approximately 40 % of the time.  She states she is exercising home calisthenics 15-30 minutes 4 times per week.  Interim History:  Gina Walker reports acute back pain with sciatica. She recently completed course of Prednisone  and endorses poor appetite due to acute pain.  Hydration-she has been striving to drink 1/2 her body weight in ounces of water per day.  Reviewed Bioimpedance Results with pt: Muscle Mass:+1.6 lbs Adipose Mass: -2.2 lbs  Subjective:   1. Mixed hyperlipidemia Discussed Labs Lipid Panel     Component Value Date/Time   CHOL 209 (H) 02/22/2024 0945   TRIG 146 02/22/2024 0945   HDL 59 02/22/2024 0945   CHOLHDL 3.8 05/26/2022 0924   LDLCALC 124 (H) 02/22/2024 0945   LABVLDL 26 02/22/2024 0945    She has NOT been taking daily Lipitor 20mg  PCP manges statin therapy Total and LDL both above goal Liver enzymes normal  2. Vitamin D  deficiency Discussed Labs  Latest Reference Range & Units 02/22/24 09:45  Vitamin D , 25-Hydroxy 30.0 - 100.0 ng/mL 32.2   Vit D Level sub therapeutic She is on bi-weekly Ergocalciferol - denies N/V/Muscle Weakness  3.  Pre-diabetes Discussed Labs  Latest Reference Range & Units 02/22/24 09:45  Glucose 70 - 99 mg/dL 899 (H)  Hemoglobin J8R 4.8 - 5.6 % 6.0 (H)  Est. average glucose Bld gHb Est-mCnc mg/dL 873  INSULIN  2.6 - 24.9 uIU/mL 28.6 (H)  (H): Data is abnormally high  CBG, A1c, Insulin - all levels improved, however above goal She is on daily Metformin  500 mg BID with meals. She denies GI upset  4. Primary hypertension Discussed Labs  02/22/2024 CMP: CKD stable  K+ level low and his been historically below goal She denies palpitations She is on daily Chlorthalidone  50mg  She started herself on oral OTC K+ supplement last year- not currently on any K+ supplementation at present  Liver enzymes normal  5. Anxiety and Depression She endorses stable mood, denies SI/HI She is on daily Zoloft  50mg   Assessment/Plan:   1. Mixed hyperlipidemia Take Lipitor 20mg  NIGHTLY  2. Vitamin D  deficiency (Primary) Refill and INCREASE Vitamin D , Ergocalciferol , (DRISDOL ) 1.25 MG (50000 UNIT) CAPS capsule Take 1 capsule (50,000 Units total) by mouth every 7 (seven) days. Dispense: 8 capsule, Refills: 0 ordered   3. Pre-diabetes Continue metformin  Limit sugar/simple CHO  4. Primary hypertension Limit Na+ DO NOT use [place] Na+ diet Refill NIFEdipine  (PROCARDIA  XL/NIFEDICAL-XL) 90 MG 24 hr tablet Take 1 tablet (90 mg total) by mouth daily. Dispense: 90 tablet, Refills: 0 ordered   Start- TAKE EVERY OTHER DAY potassium chloride  (KLOR-CON  M) 10 MEQ tablet  Take 1 tablet (10 mEq total) by mouth every other day. Dispense: 30 tablet, Refills: 0 ordered   CHECK CMP AT NEXT OV  5. Anxiety and Depression Refill sertraline  (ZOLOFT ) 50 MG tablet Take 1 tablet (50 mg total) by mouth daily. Dispense: 90 tablet, Refills: 0 ordered   6. BMI 45.0-49.9, adult (HCC),Current BMI 49.5  Octivia is not currently in the action stage of change. As such, her goal is to get back to weightloss efforts . She has agreed to  the Category 4 Plan.   1) Start K+ 10 meQ EVERY OTHER DAY 2) Check CMP at next OV 3) Avoid Na+ 4) Take statin NIGHTLY 5) Increase Ergocalciferol  from bi-weekly to weekly 6) To reduce lower ext edema while working (sedentary job)- get up and walk house every hour, use compression socks, perform seated exercises to promote blood flow and reduce edema. 7) Okay to consume mushrooms  Exercise goals: All adults should avoid inactivity. Some physical activity is better than none, and adults who participate in any amount of physical activity gain some health benefits. Adults should also include muscle-strengthening activities that involve all major muscle groups on 2 or more days a week. SEATED EXERCISES  Behavioral modification strategies: increasing lean protein intake, decreasing simple carbohydrates, increasing vegetables, increasing water intake, decreasing liquid calories, decreasing sodium intake, increasing high fiber foods, decreasing eating out, no skipping meals, meal planning and cooking strategies, keeping healthy foods in the home, ways to avoid boredom eating, ways to avoid night time snacking, better snacking choices, emotional eating strategies, avoiding temptations, planning for success, and decreasing junk food.  Meia has agreed to follow-up with our clinic in 4 weeks. She was informed of the importance of frequent follow-up visits to maximize her success with intensive lifestyle modifications for her multiple health conditions.   Check CMP at next OV   Objective:   Blood pressure 138/89, pulse 74, temperature 98.2 F (36.8 C), height 5' 3 (1.6 m), weight 279 lb (126.6 kg), SpO2 98%. Body mass index is 49.42 kg/m.  General: Cooperative, alert, well developed, in no acute distress. HEENT: Conjunctivae and lids unremarkable. Cardiovascular: Regular rhythm.  Lungs: Normal work of breathing. Neurologic: No focal deficits.   Lab Results  Component Value Date   CREATININE  1.03 (H) 02/22/2024   BUN 8 02/22/2024   NA 139 02/22/2024   K 3.1 (L) 02/22/2024   CL 95 (L) 02/22/2024   CO2 26 02/22/2024   Lab Results  Component Value Date   ALT 27 02/22/2024   AST 25 02/22/2024   ALKPHOS 94 02/22/2024   BILITOT 0.3 02/22/2024   Lab Results  Component Value Date   HGBA1C 6.0 (H) 02/22/2024   HGBA1C 6.2 (H) 10/31/2023   HGBA1C 6.2 (H) 06/15/2023   HGBA1C 6.0 (H) 01/24/2023   HGBA1C 6.1 (H) 09/19/2022   Lab Results  Component Value Date   INSULIN  28.6 (H) 02/22/2024   INSULIN  29.2 (H) 10/31/2023   INSULIN  12.7 06/15/2023   INSULIN  32.5 (H) 05/26/2022   Lab Results  Component Value Date   TSH 1.490 05/26/2022   Lab Results  Component Value Date   CHOL 209 (H) 02/22/2024   HDL 59 02/22/2024   LDLCALC 124 (H) 02/22/2024   TRIG 146 02/22/2024   CHOLHDL 3.8 05/26/2022   Lab Results  Component Value Date   VD25OH 32.2 02/22/2024   VD25OH 53.2 10/31/2023   VD25OH 68.3 06/15/2023   Lab Results  Component Value Date   WBC 4.7  01/24/2023   HGB 12.9 01/24/2023   HCT 38.8 01/24/2023   MCV 86 01/24/2023   PLT 322 01/24/2023   No results found for: IRON, TIBC, FERRITIN  Attestation Statements:   Reviewed by clinician on day of visit: allergies, medications, problem list, medical history, surgical history, family history, social history, and previous encounter notes.  I have reviewed the above documentation for accuracy and completeness, and I agree with the above. -  Corbin Hott d. Daymon Hora, NP-C

## 2024-04-01 ENCOUNTER — Other Ambulatory Visit (INDEPENDENT_AMBULATORY_CARE_PROVIDER_SITE_OTHER): Payer: Self-pay | Admitting: Family Medicine

## 2024-04-01 DIAGNOSIS — I10 Essential (primary) hypertension: Secondary | ICD-10-CM

## 2024-04-01 DIAGNOSIS — R7303 Prediabetes: Secondary | ICD-10-CM

## 2024-04-19 ENCOUNTER — Other Ambulatory Visit (HOSPITAL_COMMUNITY): Payer: Self-pay | Admitting: Internal Medicine

## 2024-04-19 DIAGNOSIS — Z1231 Encounter for screening mammogram for malignant neoplasm of breast: Secondary | ICD-10-CM

## 2024-04-20 ENCOUNTER — Other Ambulatory Visit (INDEPENDENT_AMBULATORY_CARE_PROVIDER_SITE_OTHER): Payer: Self-pay | Admitting: Adult Health

## 2024-04-20 DIAGNOSIS — E559 Vitamin D deficiency, unspecified: Secondary | ICD-10-CM

## 2024-04-25 ENCOUNTER — Ambulatory Visit (INDEPENDENT_AMBULATORY_CARE_PROVIDER_SITE_OTHER): Admitting: Family Medicine

## 2024-04-25 DIAGNOSIS — Z6841 Body Mass Index (BMI) 40.0 and over, adult: Secondary | ICD-10-CM | POA: Diagnosis not present

## 2024-04-25 DIAGNOSIS — I10 Essential (primary) hypertension: Secondary | ICD-10-CM

## 2024-04-25 DIAGNOSIS — E559 Vitamin D deficiency, unspecified: Secondary | ICD-10-CM

## 2024-04-25 MED ORDER — CHLORTHALIDONE 50 MG PO TABS: 50.0000 mg | ORAL_TABLET | Freq: Every day | ORAL | 0 refills | Status: DC

## 2024-04-25 MED ORDER — LOSARTAN POTASSIUM 25 MG PO TABS: 25.0000 mg | ORAL_TABLET | Freq: Every day | ORAL | 0 refills | Status: AC

## 2024-04-25 MED ORDER — VITAMIN D (ERGOCALCIFEROL) 1.25 MG (50000 UNIT) PO CAPS: 50000.0000 [IU] | ORAL_CAPSULE | ORAL | 0 refills | Status: AC

## 2024-04-25 NOTE — Progress Notes (Signed)
   SUBJECTIVE:  Chief Complaint: Obesity  Interim History: Since last appointment patient has been working on intermittent fasting.  She hasn't been exercising much because her back has been hurting.  Back and neck pain is the same pain that she has often experienced. She may skip a meal.  She is eating mostly greek yogurt and turkey sandwich with the 45 calorie bread. For Halloween she is possibly going to a party with her sister.   Gina Walker is here to discuss her progress with her obesity treatment plan. She is on the Category 4 Plan and states she is following her eating plan approximately 50 % of the time. She states she is exercising some.   OBJECTIVE: Visit Diagnoses: Problem List Items Addressed This Visit   None   Vitals Temp: 98.5 F (36.9 C) BP: 137/83 Pulse Rate: 97 SpO2: 98 %   Anthropometric Measurements Height: 5' 3 (1.6 m) Weight: 277 lb (125.6 kg) BMI (Calculated): 49.08 Weight at Last Visit: 279 lb Weight Lost Since Last Visit: 2 Weight Gained Since Last Visit: 0 Starting Weight: 276 lb Total Weight Loss (lbs): 0 lb (0 kg)   Body Composition  Body Fat %: 52 % Fat Mass (lbs): 144.2 lbs Muscle Mass (lbs): 126.4 lbs Total Body Water (lbs): 99.4 lbs Visceral Fat Rating : 18   Other Clinical Data Fasting: no Today's Visit #: 24 Starting Date: 05/26/22 Comments: Cat 4     ASSESSMENT AND PLAN: Assessment & Plan Primary hypertension Blood pressure controlled on combination pharmacotherapy including chlorthalidone  nifedipine  losartan .  No change in current medications or dosages at this time.  Patient denies any symptoms including chest pain, headache, or chest pressure.  Follow-up blood pressures at next appointments and make titration adjustments of pharmacotherapy as allowed.  Needs refill of chlorthalidone  and losartan  today. Vitamin D  deficiency Tolerating vitamin D  prescription strength with no side effects such as vomiting, nausea or muscle  weakness.  Refill sent into pharmacy today. Morbid obesity (HCC) with starting BMI 48.9  BMI 45.0-49.9, adult (HCC),Current BMI 49.5    Diet: Estrellita is currently in the action stage of change. As such, her goal is to continue with weight loss efforts and has agreed to practicing portion control and making smarter food choices, such as increasing vegetables and decreasing simple carbohydrates.   Exercise:  For substantial health benefits, adults should do at least 150 minutes (2 hours and 30 minutes) a week of moderate-intensity, or 75 minutes (1 hour and 15 minutes) a week of vigorous-intensity aerobic physical activity, or an equivalent combination of moderate- and vigorous-intensity aerobic activity. Aerobic activity should be performed in episodes of at least 10 minutes, and preferably, it should be spread throughout the week.  Behavior Modification:  We discussed the following Behavioral Modification Strategies today: increasing lean protein intake, decreasing simple carbohydrates, increasing vegetables, planning for success, and eating on 10:14 schedule for intermittent fasting.   Return in about 4 weeks (around 05/23/2024).   She was informed of the importance of frequent follow up visits to maximize her success with intensive lifestyle modifications for her multiple health conditions.  Attestation Statements:   Reviewed by clinician on day of visit: allergies, medications, problem list, medical history, surgical history, family history, social history, and previous encounter notes.    [name], MD

## 2024-04-29 ENCOUNTER — Ambulatory Visit (HOSPITAL_COMMUNITY)
Admission: RE | Admit: 2024-04-29 | Discharge: 2024-04-29 | Disposition: A | Discharge status: Home / Self Care | Source: Ambulatory Visit | Attending: Internal Medicine | Admitting: Internal Medicine

## 2024-04-29 ENCOUNTER — Encounter (HOSPITAL_COMMUNITY): Payer: Self-pay | Admitting: Radiology

## 2024-04-29 DIAGNOSIS — Z1231 Encounter for screening mammogram for malignant neoplasm of breast: Secondary | ICD-10-CM | POA: Diagnosis present

## 2024-05-02 NOTE — Assessment & Plan Note (Signed)
 Blood pressure controlled on combination pharmacotherapy including chlorthalidone  nifedipine  losartan .  No change in current medications or dosages at this time.  Patient denies any symptoms including chest pain, headache, or chest pressure.  Follow-up blood pressures at next appointments and make titration adjustments of pharmacotherapy as allowed.  Needs refill of chlorthalidone  and losartan  today.

## 2024-05-02 NOTE — Assessment & Plan Note (Signed)
 Tolerating vitamin D  prescription strength with no side effects such as vomiting, nausea or muscle weakness.  Refill sent into pharmacy today.

## 2024-05-11 ENCOUNTER — Other Ambulatory Visit (INDEPENDENT_AMBULATORY_CARE_PROVIDER_SITE_OTHER): Payer: Self-pay | Admitting: Adult Health

## 2024-05-28 ENCOUNTER — Encounter (INDEPENDENT_AMBULATORY_CARE_PROVIDER_SITE_OTHER): Payer: Self-pay | Admitting: Adult Health

## 2024-05-28 ENCOUNTER — Ambulatory Visit (INDEPENDENT_AMBULATORY_CARE_PROVIDER_SITE_OTHER): Payer: Self-pay | Admitting: Adult Health

## 2024-05-28 VITALS — BP 124/75 | HR 98 | Temp 98.2°F | Ht 63.0 in | Wt 286.0 lb

## 2024-05-28 DIAGNOSIS — I10 Essential (primary) hypertension: Secondary | ICD-10-CM | POA: Diagnosis not present

## 2024-05-28 DIAGNOSIS — E559 Vitamin D deficiency, unspecified: Secondary | ICD-10-CM | POA: Diagnosis not present

## 2024-05-28 DIAGNOSIS — E669 Obesity, unspecified: Secondary | ICD-10-CM | POA: Diagnosis not present

## 2024-05-28 DIAGNOSIS — R7303 Prediabetes: Secondary | ICD-10-CM

## 2024-05-28 DIAGNOSIS — Z6841 Body Mass Index (BMI) 40.0 and over, adult: Secondary | ICD-10-CM

## 2024-05-28 MED ORDER — POTASSIUM CHLORIDE CRYS ER 10 MEQ PO TBCR: 10.0000 meq | EXTENDED_RELEASE_TABLET | ORAL | 0 refills | Status: DC

## 2024-05-29 NOTE — Progress Notes (Signed)
 WEIGHT SUMMARY AND BIOMETRICS  Vitals Temp: 98.2 F (36.8 C) BP: 124/75 Pulse Rate: 98 SpO2: 99 %   Anthropometric Measurements Height: 5' 3 (1.6 m) Weight: 286 lb (129.7 kg) BMI (Calculated): 50.68 Weight at Last Visit: 277lb Weight Lost Since Last Visit: 0lb Weight Gained Since Last Visit: 9lb Starting Weight: 276lb Total Weight Loss (lbs): 0 lb (0 kg)   Body Composition  Body Fat %: 54.5 % Fat Mass (lbs): 156 lbs Muscle Mass (lbs): 123.6 lbs Visceral Fat Rating : 19   Other Clinical Data Fasting: No Labs: No Today's Visit #: 25 Starting Date: 05/26/22 Comments: Cat 4    Chief Complaint:   OBESITY Gina Walker is here to discuss her progress with her obesity treatment plan.  She is on the the Category 4 Plan and states she is following her eating plan approximately 70 % of the time.  She states she is exercising Seated Leg Lifts 15 minutes 2 times per week.  Interim History:  Ms. Vantassel reports drinking [facility] daily- One 12 oz can Reviewed [employer] Nutrition Facts: 170 calories 46g CHO  She reports following Intermittent Fasting (IF) eating pattern, first meal at 1100, last meal at 1900  Pertinent Family Hx: Her mother has T2D and HTN  Reviewed the tenets of Cat 4 MP at length, offered alternative drinks to replace soda  Subjective:   1. Primary hypertension BP at goal at OV She is on daily Losartan  25mg , daily Chlorthalidone  50mg , daily Nifedipine  90mg , and K+ 10 mEq EVERY OTHER day She denies CP or palpitations  2. Pre-diabetes She reports taking Metformin  500mg - 2 tabs with breakfast daily She denies GI upset  3. Vitamin D  deficiency  Latest Reference Range & Units 06/15/23 09:57 10/31/23 09:27 02/22/24 09:45  Vitamin D , 25-Hydroxy 30.0 - 100.0 ng/mL 68.3 53.2 32.2   She is on weekly Ergocalciferol - denies N/V/Muscle Weakness  Assessment/Plan:   1. Primary hypertension Refill   potassium chloride  (KLOR-CON  M) 10 MEQ  tablet Take 1 tablet (10 mEq total) by mouth every other day. Dispense: 30 tablet, Refills: 0 ordered   2. Pre-diabetes Continue healthy eating and regular seated exercises. Continue Metformin - denies need for refill today  3. Vitamin D  deficiency Continue weekly Ergocalciferol - denies need for refill today  4. BMI 45.0-49.9, adult (HCC),Current BMI 50.7  Yerania is currently in the action stage of change. As such, her goal is to continue with weight loss efforts. She has agreed to the Category 4 Plan.   Exercise goals: All adults should avoid inactivity. Some physical activity is better than none, and adults who participate in any amount of physical activity gain some health benefits. Adults should also include muscle-strengthening activities that involve all major muscle groups on 2 or more days a week. Seated Exercises  Behavioral modification strategies: increasing lean protein intake, decreasing simple carbohydrates, increasing vegetables, increasing water intake, meal planning and cooking strategies, keeping healthy foods in the home, ways to avoid boredom eating, and planning for success.  Gina Walker has agreed to follow-up with our clinic in 4 weeks. She was informed of the importance of frequent follow-up visits to maximize her success with intensive lifestyle modifications for her multiple health conditions.   Objective:   Blood pressure 124/75, pulse 98, temperature 98.2 F (36.8 C), height 5' 3 (1.6 m), weight 286 lb (129.7 kg), last menstrual period 10/12/2022, SpO2 99%. Body mass index is 50.66 kg/m.  General: Cooperative, alert, well developed, in no acute distress. HEENT:  Conjunctivae and lids unremarkable. Cardiovascular: Regular rhythm.  Lungs: Normal work of breathing. Neurologic: No focal deficits.   Lab Results  Component Value Date   CREATININE 1.03 (H) 02/22/2024   BUN 8 02/22/2024   NA 139 02/22/2024   K 3.1 (L) 02/22/2024   CL 95 (L) 02/22/2024   CO2 26  02/22/2024   Lab Results  Component Value Date   ALT 27 02/22/2024   AST 25 02/22/2024   ALKPHOS 94 02/22/2024   BILITOT 0.3 02/22/2024   Lab Results  Component Value Date   HGBA1C 6.0 (H) 02/22/2024   HGBA1C 6.2 (H) 10/31/2023   HGBA1C 6.2 (H) 06/15/2023   HGBA1C 6.0 (H) 01/24/2023   HGBA1C 6.1 (H) 09/19/2022   Lab Results  Component Value Date   INSULIN  28.6 (H) 02/22/2024   INSULIN  29.2 (H) 10/31/2023   INSULIN  12.7 06/15/2023   INSULIN  32.5 (H) 05/26/2022   Lab Results  Component Value Date   TSH 1.490 05/26/2022   Lab Results  Component Value Date   CHOL 209 (H) 02/22/2024   HDL 59 02/22/2024   LDLCALC 124 (H) 02/22/2024   TRIG 146 02/22/2024   CHOLHDL 3.8 05/26/2022   Lab Results  Component Value Date   VD25OH 32.2 02/22/2024   VD25OH 53.2 10/31/2023   VD25OH 68.3 06/15/2023   Lab Results  Component Value Date   WBC 4.7 01/24/2023   HGB 12.9 01/24/2023   HCT 38.8 01/24/2023   MCV 86 01/24/2023   PLT 322 01/24/2023   No results found for: IRON, TIBC, FERRITIN  Attestation Statements:   Reviewed by clinician on day of visit: allergies, medications, problem list, medical history, surgical history, family history, social history, and previous encounter notes.  I have reviewed the above documentation for accuracy and completeness, and I agree with the above. -  Shawntina Diffee d. Pam Vanalstine, NP-C

## 2024-06-18 ENCOUNTER — Other Ambulatory Visit (INDEPENDENT_AMBULATORY_CARE_PROVIDER_SITE_OTHER): Payer: Self-pay | Admitting: Adult Health

## 2024-06-18 DIAGNOSIS — F419 Anxiety disorder, unspecified: Secondary | ICD-10-CM

## 2024-06-30 ENCOUNTER — Other Ambulatory Visit (INDEPENDENT_AMBULATORY_CARE_PROVIDER_SITE_OTHER): Payer: Self-pay | Admitting: Family Medicine

## 2024-06-30 DIAGNOSIS — E559 Vitamin D deficiency, unspecified: Secondary | ICD-10-CM

## 2024-07-01 ENCOUNTER — Other Ambulatory Visit (INDEPENDENT_AMBULATORY_CARE_PROVIDER_SITE_OTHER): Payer: Self-pay | Admitting: Family Medicine

## 2024-07-01 DIAGNOSIS — I10 Essential (primary) hypertension: Secondary | ICD-10-CM

## 2024-07-04 ENCOUNTER — Ambulatory Visit (INDEPENDENT_AMBULATORY_CARE_PROVIDER_SITE_OTHER): Admitting: Family Medicine

## 2024-07-04 VITALS — BP 137/83 | HR 104 | Temp 98.1°F | Ht 63.0 in | Wt 286.0 lb

## 2024-07-04 DIAGNOSIS — R7303 Prediabetes: Secondary | ICD-10-CM

## 2024-07-04 DIAGNOSIS — E559 Vitamin D deficiency, unspecified: Secondary | ICD-10-CM

## 2024-07-04 DIAGNOSIS — I10 Essential (primary) hypertension: Secondary | ICD-10-CM | POA: Diagnosis not present

## 2024-07-04 DIAGNOSIS — Z6841 Body Mass Index (BMI) 40.0 and over, adult: Secondary | ICD-10-CM | POA: Diagnosis not present

## 2024-07-04 DIAGNOSIS — E782 Mixed hyperlipidemia: Secondary | ICD-10-CM

## 2024-07-04 MED ORDER — CHLORTHALIDONE 50 MG PO TABS: 50.0000 mg | ORAL_TABLET | Freq: Every day | ORAL | 0 refills | Status: AC

## 2024-07-04 NOTE — Progress Notes (Signed)
 "  SUBJECTIVE:  Chief Complaint: Obesity  Interim History: patient here for follow up.  She has been back on meal plan around 80%.  She is exercising 15 minutes 3 times a week.  She has a kettlebell that she is using for exercise until she tires.  She is getting mostly her protein in but is able to get in 80% of all her food in. Her birthday is tomorrow and she is having a small get together at his house.   Gina Walker is here to discuss her progress with her obesity treatment plan. She is on the Category 4 Plan and states she is following her eating plan approximately 80 % of the time. She states she is exercising 15 minutes 3 times per week.   OBJECTIVE: Visit Diagnoses: Problem List Items Addressed This Visit       Cardiovascular and Mediastinum   Primary hypertension - Primary   Relevant Medications   chlorthalidone  (HYGROTON ) 50 MG tablet   Other Relevant Orders   Comprehensive metabolic panel with GFR     Other   Morbid obesity (HCC) with starting BMI 48.9 (Chronic)   Hyperlipidemia   Relevant Medications   chlorthalidone  (HYGROTON ) 50 MG tablet   Other Relevant Orders   Lipid Panel With LDL/HDL Ratio   Pre-diabetes   Relevant Orders   Hemoglobin A1c   Insulin , random   Vitamin D  deficiency   Relevant Orders   VITAMIN D  25 Hydroxy (Vit-D Deficiency, Fractures)   Other Visit Diagnoses       BMI 50.0-59.9, adult (HCC)           Vitals Temp: 98.1 F (36.7 C) BP: 137/83 Pulse Rate: (!) 104 SpO2: 99 %   Anthropometric Measurements Height: 5' 3 (1.6 m) Weight: 286 lb (129.7 kg) BMI (Calculated): 50.68 Weight at Last Visit: 286 lb Weight Lost Since Last Visit: 0 Weight Gained Since Last Visit: 0 Starting Weight: 276 lb Total Weight Loss (lbs): 0 lb (0 kg)   Body Composition  Body Fat %: 54.5 % Fat Mass (lbs): 155.8 lbs Muscle Mass (lbs): 124 lbs Total Body Water (lbs): 19 lbs   Other Clinical Data Today's Visit #: 26 Starting Date:  05/26/22 Comments: Cat 4     ASSESSMENT AND PLAN: Assessment & Plan Primary hypertension BP controlled today.  No chest pain, chest pressure or headache.  Needs a refill of chlorthalidone  and CMP today to assess kidney function and electrolytes.  Discuss lab results at next apppointment Pre-diabetes Patient has been working on dietary changes to better control her blood sugars and insulin  resistance.  Will order A1c and Insulin  level today.  Discuss results at next appointment. Vitamin D  deficiency On vitamin D  supplementation and needs repeat lab to see response this Vitamin D  formulation.  No nausea, vomiting or muscle weakness mentioned. Mixed hyperlipidemia Patient needs repeat fasting labs prior to next appointment- lipid panel ordered.  Will risk stratify with new results at that time.  Will continue current dietary modifications to assist with management. BMI 50.0-59.9, adult (HCC)  Morbid obesity (HCC) with starting BMI 48.9    Diet: Odalis is currently in the action stage of change. As such, her goal is to continue with weight loss efforts and has agreed to the Category 4 Plan. Focus over the next few weeks will be on getting protein in daily.    Exercise:  For substantial health benefits, adults should do at least 150 minutes (2 hours and 30 minutes) a week of moderate-intensity,  or 75 minutes (1 hour and 15 minutes) a week of vigorous-intensity aerobic physical activity, or an equivalent combination of moderate- and vigorous-intensity aerobic activity. Aerobic activity should be performed in episodes of at least 10 minutes, and preferably, it should be spread throughout the week.  Behavior Modification:  We discussed the following Behavioral Modification Strategies today: increasing lean protein intake, decreasing simple carbohydrates, no skipping meals, meal planning and cooking strategies, celebration eating strategies, and planning for success. We discussed various  medication options to help Jermika with her weight loss efforts and we both agreed to continue lifestyle changes at this time but discuss oral wegovy at next appointment.  No follow-ups on file.   She was informed of the importance of frequent follow up visits to maximize her success with intensive lifestyle modifications for her multiple health conditions.  Attestation Statements:   Reviewed by clinician on day of visit: allergies, medications, problem list, medical history, surgical history, family history, social history, and previous encounter notes.    [name], MD "

## 2024-07-11 ENCOUNTER — Other Ambulatory Visit (INDEPENDENT_AMBULATORY_CARE_PROVIDER_SITE_OTHER): Payer: Self-pay | Admitting: Adult Health

## 2024-07-14 NOTE — Assessment & Plan Note (Signed)
 Patient needs repeat fasting labs prior to next appointment- lipid panel ordered.  Will risk stratify with new results at that time.  Will continue current dietary modifications to assist with management.

## 2024-07-14 NOTE — Assessment & Plan Note (Signed)
 BP controlled today.  No chest pain, chest pressure or headache.  Needs a refill of chlorthalidone  and CMP today to assess kidney function and electrolytes.  Discuss lab results at next apppointment

## 2024-07-14 NOTE — Assessment & Plan Note (Signed)
 Patient has been working on dietary changes to better control her blood sugars and insulin  resistance.  Will order A1c and Insulin  level today.  Discuss results at next appointment.

## 2024-07-14 NOTE — Assessment & Plan Note (Signed)
 On vitamin D  supplementation and needs repeat lab to see response this Vitamin D  formulation.  No nausea, vomiting or muscle weakness mentioned.

## 2024-07-18 ENCOUNTER — Other Ambulatory Visit (INDEPENDENT_AMBULATORY_CARE_PROVIDER_SITE_OTHER): Payer: Self-pay | Admitting: Adult Health

## 2024-07-18 DIAGNOSIS — I10 Essential (primary) hypertension: Secondary | ICD-10-CM

## 2024-08-05 ENCOUNTER — Ambulatory Visit (INDEPENDENT_AMBULATORY_CARE_PROVIDER_SITE_OTHER): Admitting: Family Medicine

## 2024-08-21 ENCOUNTER — Ambulatory Visit (INDEPENDENT_AMBULATORY_CARE_PROVIDER_SITE_OTHER): Admitting: Family Medicine
